# Patient Record
Sex: Male | Born: 1985 | Race: Black or African American | Hispanic: No | Marital: Single | Smoking: Former smoker
Health system: Southern US, Community
[De-identification: ages and names within clinical notes are randomized; demographics above are authoritative.]

## PROBLEM LIST (undated history)

## (undated) DIAGNOSIS — L0291 Cutaneous abscess, unspecified: Secondary | ICD-10-CM

---
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
## 2003-08-25 ENCOUNTER — Emergency Department (HOSPITAL_COMMUNITY): Admission: EM | Admit: 2003-08-25 | Discharge: 2003-08-26 | Payer: Self-pay | Admitting: Emergency Medicine

## 2003-08-26 ENCOUNTER — Encounter: Payer: Self-pay | Admitting: Emergency Medicine

## 2004-09-12 ENCOUNTER — Emergency Department (HOSPITAL_COMMUNITY): Admission: EM | Admit: 2004-09-12 | Discharge: 2004-09-12 | Payer: Self-pay | Admitting: Emergency Medicine

## 2004-11-02 ENCOUNTER — Emergency Department (HOSPITAL_COMMUNITY): Admission: EM | Admit: 2004-11-02 | Discharge: 2004-11-02 | Payer: Self-pay

## 2005-08-13 ENCOUNTER — Emergency Department (HOSPITAL_COMMUNITY): Admission: EM | Admit: 2005-08-13 | Discharge: 2005-08-13 | Payer: Self-pay | Admitting: Emergency Medicine

## 2007-02-09 ENCOUNTER — Emergency Department (HOSPITAL_COMMUNITY): Admission: EM | Admit: 2007-02-09 | Discharge: 2007-02-10 | Payer: Self-pay | Admitting: Emergency Medicine

## 2007-10-10 ENCOUNTER — Emergency Department (HOSPITAL_COMMUNITY): Admission: EM | Admit: 2007-10-10 | Discharge: 2007-10-10 | Payer: Self-pay | Admitting: Emergency Medicine

## 2008-02-10 ENCOUNTER — Emergency Department (HOSPITAL_COMMUNITY): Admission: EM | Admit: 2008-02-10 | Discharge: 2008-02-10 | Payer: Self-pay | Admitting: Emergency Medicine

## 2008-04-06 ENCOUNTER — Emergency Department (HOSPITAL_COMMUNITY): Admission: EM | Admit: 2008-04-06 | Discharge: 2008-04-06 | Payer: Self-pay | Admitting: Emergency Medicine

## 2008-04-21 ENCOUNTER — Emergency Department (HOSPITAL_COMMUNITY): Admission: EM | Admit: 2008-04-21 | Discharge: 2008-04-21 | Payer: Self-pay | Admitting: Emergency Medicine

## 2008-05-25 ENCOUNTER — Emergency Department (HOSPITAL_COMMUNITY): Admission: EM | Admit: 2008-05-25 | Discharge: 2008-05-25 | Payer: Self-pay | Admitting: Emergency Medicine

## 2008-12-04 ENCOUNTER — Emergency Department (HOSPITAL_COMMUNITY): Admission: EM | Admit: 2008-12-04 | Discharge: 2008-12-04 | Payer: Self-pay | Admitting: Emergency Medicine

## 2009-01-10 ENCOUNTER — Emergency Department (HOSPITAL_COMMUNITY): Admission: EM | Admit: 2009-01-10 | Discharge: 2009-01-10 | Payer: Self-pay | Admitting: Emergency Medicine

## 2009-03-01 ENCOUNTER — Emergency Department (HOSPITAL_COMMUNITY): Admission: EM | Admit: 2009-03-01 | Discharge: 2009-03-01 | Payer: Self-pay | Admitting: Emergency Medicine

## 2009-03-21 ENCOUNTER — Emergency Department (HOSPITAL_COMMUNITY): Admission: EM | Admit: 2009-03-21 | Discharge: 2009-03-21 | Payer: Self-pay | Admitting: Emergency Medicine

## 2009-08-09 ENCOUNTER — Emergency Department (HOSPITAL_COMMUNITY): Admission: EM | Admit: 2009-08-09 | Discharge: 2009-08-09 | Payer: Self-pay | Admitting: Emergency Medicine

## 2009-10-12 ENCOUNTER — Emergency Department (HOSPITAL_COMMUNITY): Admission: EM | Admit: 2009-10-12 | Discharge: 2009-10-12 | Payer: Self-pay | Admitting: Emergency Medicine

## 2010-10-29 ENCOUNTER — Emergency Department (HOSPITAL_BASED_OUTPATIENT_CLINIC_OR_DEPARTMENT_OTHER): Admission: EM | Admit: 2010-10-29 | Discharge: 2010-10-29 | Payer: Self-pay | Admitting: Emergency Medicine

## 2010-11-28 ENCOUNTER — Emergency Department (HOSPITAL_COMMUNITY)
Admission: EM | Admit: 2010-11-28 | Discharge: 2010-11-28 | Payer: Self-pay | Source: Home / Self Care | Admitting: Emergency Medicine

## 2011-01-28 ENCOUNTER — Emergency Department (HOSPITAL_BASED_OUTPATIENT_CLINIC_OR_DEPARTMENT_OTHER)
Admission: EM | Admit: 2011-01-28 | Discharge: 2011-01-28 | Disposition: A | Discharge status: Home / Self Care | Payer: Self-pay | Attending: Emergency Medicine | Admitting: Emergency Medicine

## 2011-01-28 ENCOUNTER — Emergency Department (INDEPENDENT_AMBULATORY_CARE_PROVIDER_SITE_OTHER): Payer: Self-pay

## 2011-01-28 DIAGNOSIS — J40 Bronchitis, not specified as acute or chronic: Secondary | ICD-10-CM | POA: Insufficient documentation

## 2011-01-28 DIAGNOSIS — R05 Cough: Secondary | ICD-10-CM

## 2011-01-28 DIAGNOSIS — R059 Cough, unspecified: Secondary | ICD-10-CM | POA: Insufficient documentation

## 2011-02-04 ENCOUNTER — Emergency Department (HOSPITAL_BASED_OUTPATIENT_CLINIC_OR_DEPARTMENT_OTHER)
Admission: EM | Admit: 2011-02-04 | Discharge: 2011-02-04 | Disposition: A | Discharge status: Home / Self Care | Payer: Self-pay | Attending: Emergency Medicine | Admitting: Emergency Medicine

## 2011-02-04 DIAGNOSIS — R197 Diarrhea, unspecified: Secondary | ICD-10-CM | POA: Insufficient documentation

## 2011-03-15 ENCOUNTER — Emergency Department (HOSPITAL_BASED_OUTPATIENT_CLINIC_OR_DEPARTMENT_OTHER)
Admission: EM | Admit: 2011-03-15 | Discharge: 2011-03-15 | Disposition: A | Discharge status: Home / Self Care | Payer: Self-pay | Attending: Emergency Medicine | Admitting: Emergency Medicine

## 2011-03-15 DIAGNOSIS — R112 Nausea with vomiting, unspecified: Secondary | ICD-10-CM | POA: Insufficient documentation

## 2011-03-15 DIAGNOSIS — R197 Diarrhea, unspecified: Secondary | ICD-10-CM | POA: Insufficient documentation

## 2011-03-15 LAB — URINALYSIS, ROUTINE W REFLEX MICROSCOPIC
Ketones, ur: 15 mg/dL — AB
Nitrite: NEGATIVE
Specific Gravity, Urine: 1.023 (ref 1.005–1.030)
Urobilinogen, UA: 0.2 mg/dL (ref 0.0–1.0)

## 2011-06-12 ENCOUNTER — Emergency Department (HOSPITAL_BASED_OUTPATIENT_CLINIC_OR_DEPARTMENT_OTHER)
Admission: EM | Admit: 2011-06-12 | Discharge: 2011-06-12 | Disposition: A | Discharge status: Home / Self Care | Payer: Self-pay | Attending: Emergency Medicine | Admitting: Emergency Medicine

## 2011-06-12 ENCOUNTER — Emergency Department (INDEPENDENT_AMBULATORY_CARE_PROVIDER_SITE_OTHER): Payer: Self-pay

## 2011-06-12 DIAGNOSIS — R079 Chest pain, unspecified: Secondary | ICD-10-CM | POA: Insufficient documentation

## 2011-06-12 LAB — CK TOTAL AND CKMB (NOT AT ARMC)
CK, MB: 1.9 ng/mL (ref 0.3–4.0)
Relative Index: 1.1 (ref 0.0–2.5)
Total CK: 178 U/L (ref 7–232)

## 2011-06-12 LAB — DIFFERENTIAL
Eosinophils Absolute: 0.1 10*3/uL (ref 0.0–0.7)
Eosinophils Relative: 2 % (ref 0–5)
Lymphs Abs: 2.5 10*3/uL (ref 0.7–4.0)
Monocytes Absolute: 0.6 10*3/uL (ref 0.1–1.0)
Neutro Abs: 1.2 10*3/uL — ABNORMAL LOW (ref 1.7–7.7)
Neutrophils Relative %: 27 % — ABNORMAL LOW (ref 43–77)

## 2011-06-12 LAB — CBC
HCT: 40.5 % (ref 39.0–52.0)
Hemoglobin: 14.4 g/dL (ref 13.0–17.0)
MCHC: 35.6 g/dL (ref 30.0–36.0)
MCV: 76.9 fL — ABNORMAL LOW (ref 78.0–100.0)
WBC: 4.4 10*3/uL (ref 4.0–10.5)

## 2011-06-12 LAB — BASIC METABOLIC PANEL
Chloride: 106 mEq/L (ref 96–112)
Glucose, Bld: 92 mg/dL (ref 70–99)

## 2011-06-12 LAB — TROPONIN I: Troponin I: <0.3 ng/mL (ref <0.30)

## 2011-08-20 ENCOUNTER — Encounter: Payer: Self-pay | Admitting: *Deleted

## 2011-08-20 ENCOUNTER — Emergency Department (HOSPITAL_BASED_OUTPATIENT_CLINIC_OR_DEPARTMENT_OTHER)
Admission: EM | Admit: 2011-08-20 | Discharge: 2011-08-20 | Disposition: A | Discharge status: Home / Self Care | Payer: Self-pay | Attending: Emergency Medicine | Admitting: Emergency Medicine

## 2011-08-20 DIAGNOSIS — H00019 Hordeolum externum unspecified eye, unspecified eyelid: Secondary | ICD-10-CM | POA: Insufficient documentation

## 2011-08-20 DIAGNOSIS — H5710 Ocular pain, unspecified eye: Secondary | ICD-10-CM | POA: Insufficient documentation

## 2011-08-20 NOTE — ED Provider Notes (Signed)
History     CSN: [id number] Arrival date & time: 08/20/2011  7:10 PM  Chief Complaint  Patient presents with  . Eye Pain   HPI Comments: Pt states that he thinks that he has a stye in his left eye but he has been using some old drops that he didn't think he was getting any relief  Patient is a 25 y.o. male presenting with eye pain. The history is provided by the patient. No language interpreter was used.  Eye Pain This is a new problem. The current episode started in the past 7 days. The problem occurs constantly. The problem has been unchanged. Pertinent negatives include no fever or sore throat. The symptoms are aggravated by nothing.    History reviewed. No pertinent past medical history.  History reviewed. No pertinent past surgical history.  History reviewed. No pertinent family history.  History  Substance Use Topics  . Smoking status: Never Smoker   . Smokeless tobacco: Not on file  . Alcohol Use: No      Review of Systems  Constitutional: Negative for fever.  HENT: Negative for sore throat.   Eyes: Positive for pain.  All other systems reviewed and are negative.    Physical Exam  BP 135/80  Pulse 67  Temp(Src) 98.9 F (37.2 C) (Oral)  Resp 18  Ht 5\' 8"  (1.727 m)  Wt 197 lb (89.359 kg)  BMI 29.95 kg/m2  SpO2 100%  Physical Exam  Nursing note and vitals reviewed. Constitutional: He appears well-developed and well-nourished.  HENT:  Head: Normocephalic and atraumatic.  Eyes: Conjunctivae and EOM are normal. Pupils are equal, round, and reactive to light. Left eye exhibits hordeolum.  Cardiovascular: Normal rate and regular rhythm.   Pulmonary/Chest: Effort normal and breath sounds normal.    ED Course  Procedures  MDM Discussed treatment with the pt for stye      Teressa Lower, NP 08/20/11 1940

## 2011-08-20 NOTE — ED Notes (Signed)
Pt states his left eye was draining and itching before he noticed the sty in his eye.

## 2011-08-21 NOTE — ED Provider Notes (Signed)
Medical screening examination/treatment/procedure(s) were performed by non-physician practitioner and as supervising physician I was immediately available for consultation/collaboration.  Erynne Kealey M Analis Distler, MD 08/21/11 1326 

## 2012-01-09 ENCOUNTER — Emergency Department (HOSPITAL_BASED_OUTPATIENT_CLINIC_OR_DEPARTMENT_OTHER)
Admission: EM | Admit: 2012-01-09 | Discharge: 2012-01-09 | Disposition: A | Discharge status: Home / Self Care | Payer: Self-pay | Attending: Emergency Medicine | Admitting: Emergency Medicine

## 2012-01-09 ENCOUNTER — Encounter (HOSPITAL_BASED_OUTPATIENT_CLINIC_OR_DEPARTMENT_OTHER): Payer: Self-pay | Admitting: *Deleted

## 2012-01-09 DIAGNOSIS — R05 Cough: Secondary | ICD-10-CM | POA: Insufficient documentation

## 2012-01-09 DIAGNOSIS — IMO0001 Reserved for inherently not codable concepts without codable children: Secondary | ICD-10-CM | POA: Insufficient documentation

## 2012-01-09 DIAGNOSIS — J069 Acute upper respiratory infection, unspecified: Secondary | ICD-10-CM | POA: Insufficient documentation

## 2012-01-09 DIAGNOSIS — R059 Cough, unspecified: Secondary | ICD-10-CM | POA: Insufficient documentation

## 2012-01-09 MED ORDER — LORATADINE-PSEUDOEPHEDRINE ER 10-240 MG PO TB24: 1.0000 | ORAL_TABLET | Freq: Every day | ORAL | Status: DC

## 2012-01-09 MED ORDER — NAPROXEN 500 MG PO TABS: 500.0000 mg | ORAL_TABLET | Freq: Two times a day (BID) | ORAL | Status: DC

## 2012-01-09 NOTE — ED Notes (Signed)
Aching all over, sore throat, stuffy nose and feels tired. Symptoms x 2 days.

## 2012-01-09 NOTE — ED Provider Notes (Signed)
History     CSN: [id number]  Arrival date & time 01/09/12  1110   First MD Initiated Contact with Patient 01/09/12 1124      Chief Complaint  Patient presents with  . URI    (Consider location/radiation/quality/duration/timing/severity/associated sxs/prior treatment) Patient is a 26 y.o. male presenting with URI. The history is provided by the patient. No language interpreter was used.  URI The primary symptoms include fatigue, cough and myalgias. Primary symptoms do not include fever, headaches, ear pain, sore throat, abdominal pain, nausea, vomiting or arthralgias. The current episode started 2 days ago. This is a new problem. The problem has been gradually worsening.  The fatigue began today. The fatigue has been worsening since its onset.  The cough began 2 days ago. The cough is new. The cough is dry.  Myalgias began 2 days ago. The myalgias have been unchanged since their onset. The myalgias are generalized. The myalgias are dull. The discomfort from the myalgias is mild. The myalgias are not associated with weakness.  Symptoms associated with the illness include congestion and rhinorrhea.    History reviewed. No pertinent past medical history.  History reviewed. No pertinent past surgical history.  No family history on file.  History  Substance Use Topics  . Smoking status: Never Smoker   . Smokeless tobacco: Not on file  . Alcohol Use: No      Review of Systems  Constitutional: Positive for fatigue. Negative for fever, activity change and appetite change.  HENT: Positive for congestion and rhinorrhea. Negative for ear pain, sore throat, neck pain and neck stiffness.   Respiratory: Positive for cough. Negative for shortness of breath.   Cardiovascular: Negative for chest pain and palpitations.  Gastrointestinal: Negative for nausea, vomiting and abdominal pain.  Genitourinary: Negative for dysuria, urgency, frequency and flank pain.  Musculoskeletal: Positive for  myalgias. Negative for back pain and arthralgias.  Neurological: Negative for dizziness, weakness, light-headedness, numbness and headaches.  All other systems reviewed and are negative.    Allergies  Review of patient's allergies indicates no known allergies.  Home Medications   Current Outpatient Rx  Name Route Sig Dispense Refill  . LORATADINE-PSEUDOEPHEDRINE ER 10-240 MG PO TB24 Oral Take 1 tablet by mouth daily. 5 tablet 0  . NAPROXEN 500 MG PO TABS Oral Take 1 tablet (500 mg total) by mouth 2 (two) times daily. 30 tablet 0    BP 133/66  Pulse 83  Temp(Src) 98.8 F (37.1 C) (Oral)  Resp 20  SpO2 99%  Physical Exam  Nursing note and vitals reviewed. Constitutional: He is oriented to person, place, and time. He appears well-developed and well-nourished. No distress.  HENT:  Head: Normocephalic and atraumatic.  Right Ear: External ear normal.  Left Ear: External ear normal.  Mouth/Throat: Oropharynx is clear and moist. No oropharyngeal exudate.  Eyes: Conjunctivae and EOM are normal. Pupils are equal, round, and reactive to light.  Neck: Normal range of motion. Neck supple.  Cardiovascular: Normal rate, regular rhythm, normal heart sounds and intact distal pulses.  Exam reveals no gallop and no friction rub.   No murmur heard. Pulmonary/Chest: Effort normal and breath sounds normal. No respiratory distress.  Abdominal: Soft. There is no tenderness.  Musculoskeletal: Normal range of motion. He exhibits no tenderness.  Lymphadenopathy:    He has no cervical adenopathy.  Neurological: He is alert and oriented to person, place, and time. No cranial nerve deficit.  Skin: Skin is warm and dry. No rash noted.  ED Course  Procedures (including critical care time)  Labs Reviewed - No data to display No results found.   1. URI (upper respiratory infection)       MDM   consistent with URI. I have no concern for influenza at this time. There is no indication for  chest x-ray or additional testing at this time. He'll be placed on Naprosyn for myalgias and Claritin-D for his symptom relief. Instructed to remain hydrated at home and to followup with his primary care physician as needed         Dayton Bailiff, MD 01/09/12 1140

## 2012-02-18 ENCOUNTER — Encounter (HOSPITAL_BASED_OUTPATIENT_CLINIC_OR_DEPARTMENT_OTHER): Payer: Self-pay | Admitting: *Deleted

## 2012-02-18 ENCOUNTER — Emergency Department (HOSPITAL_BASED_OUTPATIENT_CLINIC_OR_DEPARTMENT_OTHER)
Admission: EM | Admit: 2012-02-18 | Discharge: 2012-02-18 | Disposition: A | Discharge status: Home Health | Payer: Self-pay | Attending: Emergency Medicine | Admitting: Emergency Medicine

## 2012-02-18 DIAGNOSIS — H00019 Hordeolum externum unspecified eye, unspecified eyelid: Secondary | ICD-10-CM

## 2012-02-18 DIAGNOSIS — H5789 Other specified disorders of eye and adnexa: Secondary | ICD-10-CM | POA: Insufficient documentation

## 2012-02-18 MED ORDER — ERYTHROMYCIN 5 MG/GM OP OINT: TOPICAL_OINTMENT | OPHTHALMIC | Status: AC

## 2012-02-18 NOTE — Discharge Instructions (Signed)
Sty  A sty (hordeolum) is an infection of a gland in the eyelid located at the base of the eyelash. A sty may develop a white or yellow head of pus. It can be puffy (swollen). Usually, the sty will burst and pus will come out on its own. They do not leave lumps in the eyelid once they drain.  A sty is often confused with another form of cyst of the eyelid called a chalazion. Chalazions occur within the eyelid and not on the edge where the bases of the eyelashes are. They often are red, sore and then form firm lumps in the eyelid.  CAUSES    Germs (bacteria).   Lasting (chronic) eyelid inflammation.  SYMPTOMS    Tenderness, redness and swelling along the edge of the eyelid at the base of the eyelashes.   Sometimes, there is a white or yellow head of pus. It may or may not drain.  DIAGNOSIS   An ophthalmologist will be able to distinguish between a sty and a chalazion and treat the condition appropriately.   TREATMENT    Styes are typically treated with warm packs (compresses) until drainage occurs.   In rare cases, medicines that kill germs (antibiotics) may be prescribed. These antibiotics may be in the form of drops, cream or pills.   If a hard lump has formed, it is generally necessary to do a small incision and remove the hardened contents of the cyst in a minor surgical procedure done in the office.   In suspicious cases, your caregiver may send the contents of the cyst to the lab to be certain that it is not a rare, but dangerous form of cancer of the glands of the eyelid.  HOME CARE INSTRUCTIONS    Wash your hands often and dry them with a clean towel. Avoid touching your eyelid. This may spread the infection to other parts of the eye.   Apply heat to your eyelid for 10 to 20 minutes, several times a day, to ease pain and help to heal it faster.   Do not squeeze the sty. Allow it to drain on its own. Wash your eyelid carefully 3 to 4 times per day to remove any pus.  SEEK IMMEDIATE MEDICAL CARE IF:     Your eye becomes painful or puffy (swollen).   Your vision changes.   Your sty does not drain by itself within 3 days.   Your sty comes back within a short period of time, even with treatment.   You have redness (inflammation) around the eye.   You have a fever.  Document Released: 09/13/2005 Document Revised: 11/23/2011 Document Reviewed: 05/18/2009  [employer] Patient Information 2012 [employer]

## 2012-02-18 NOTE — ED Notes (Signed)
Pt with swollen red right eyelid states that he recently has had one in the opposite eye

## 2012-02-18 NOTE — ED Provider Notes (Signed)
History   This chart was scribed for Felisa Bonier, MD by Melba Coon. The patient was seen in room [facility] and the patient's care was started at 9:04PM.    CSN: [id number]  Arrival date & time 02/18/12  1949   First MD Initiated Contact with Patient 02/18/12 2045      Chief Complaint  Patient presents with  . Stye    (Consider location/radiation/quality/duration/timing/severity/associated sxs/prior treatment) HPI Craig Wheeler is a 26 y.o. male who presents to the Emergency Department complaining of a persistent, moderate to severe stye of the right upper eyelid. Pt states that he has recently had a stye on the left eyelid. Pt has been doing warm compresses over the affected area with a warm washcloth. Eye drainage present. No HA, no orbital pain behind posterior right eye with occular movement, neck pain, CP, abd pain, extremity pain or edema. No known allergies. No other pertinent medical symptoms.  History reviewed. No pertinent past medical history.  History reviewed. No pertinent past surgical history.  History reviewed. No pertinent family history.  History  Substance Use Topics  . Smoking status: Never Smoker   . Smokeless tobacco: Not on file  . Alcohol Use: No      Review of Systems 10 Systems reviewed and are negative for acute change except as noted in the HPI.  Allergies  Review of patient's allergies indicates no known allergies.  Home Medications   Current Outpatient Rx  Name Route Sig Dispense Refill  . LORATADINE-PSEUDOEPHEDRINE ER 10-240 MG PO TB24 Oral Take 1 tablet by mouth daily. 5 tablet 0  . NAPROXEN 500 MG PO TABS Oral Take 1 tablet (500 mg total) by mouth 2 (two) times daily. 30 tablet 0    BP 126/78  Pulse 78  Temp(Src) 98.8 F (37.1 C) (Oral)  Resp 16  SpO2 100%  Physical Exam  Nursing note and vitals reviewed. Constitutional: He is oriented to person, place, and time. He appears well-developed and well-nourished. No  distress.       Awake, alert, nontoxic appearance.  HENT:  Head: Normocephalic and atraumatic.  Right Ear: External ear normal.  Left Ear: External ear normal.  Mouth/Throat: Oropharynx is clear and moist.  Eyes: Conjunctivae and EOM are normal. Pupils are equal, round, and reactive to light. Right eye exhibits no discharge. Left eye exhibits no discharge.       Swelling and erythema of right upper eyelid with concentration at lateral aspect, no pointing fluctuance, evidence of prior drainage; EDMD everted upper eyelid - no evidence of foreign body or infection; no erythema around orbit to suggest cellulitis.  Neck: Normal range of motion. Neck supple.  Cardiovascular: Normal rate, regular rhythm and normal heart sounds.  Exam reveals no gallop and no friction rub.   No murmur heard. Pulmonary/Chest: Effort normal and breath sounds normal. No respiratory distress. He exhibits no tenderness.  Abdominal: Soft. There is no tenderness. There is no rebound.  Musculoskeletal: He exhibits no tenderness.       Baseline ROM, no obvious new focal weakness.  Lymphadenopathy:    He has no cervical adenopathy.  Neurological: He is alert and oriented to person, place, and time.       Mental status and motor strength appears baseline for patient and situation.  Skin: Skin is warm and dry. No rash noted.  Psychiatric: He has a normal mood and affect. His behavior is normal.    ED Course  Procedures (including critical care time)  DIAGNOSTIC STUDIES: Oxygen Saturation is 100% on room air, normal by my interpretation.    COORDINATION OF CARE:  9:09PM - EDMD will prescribe abx (bacitracin) for right upper eyelid.   Labs Reviewed - No data to display No results found.   No diagnosis found.    MDM   Pt has apparent uncomplicated hordeolum. EDMD will advise to continue warm, moist compresses and will prescribe ophthalmic abx.  I personally performed the services described in this  documentation, which was scribed in my presence. The recorded information has been reviewed and considered.      Felisa Bonier, MD 02/18/12 2120

## 2012-12-11 DIAGNOSIS — R109 Unspecified abdominal pain: Secondary | ICD-10-CM | POA: Insufficient documentation

## 2012-12-11 DIAGNOSIS — K5289 Other specified noninfective gastroenteritis and colitis: Secondary | ICD-10-CM | POA: Insufficient documentation

## 2012-12-12 ENCOUNTER — Emergency Department (HOSPITAL_BASED_OUTPATIENT_CLINIC_OR_DEPARTMENT_OTHER)
Admission: EM | Admit: 2012-12-12 | Discharge: 2012-12-12 | Disposition: A | Discharge status: Home / Self Care | Payer: Self-pay | Attending: Emergency Medicine | Admitting: Emergency Medicine

## 2012-12-12 ENCOUNTER — Encounter (HOSPITAL_BASED_OUTPATIENT_CLINIC_OR_DEPARTMENT_OTHER): Payer: Self-pay | Admitting: Emergency Medicine

## 2012-12-12 DIAGNOSIS — K529 Noninfective gastroenteritis and colitis, unspecified: Secondary | ICD-10-CM

## 2012-12-12 LAB — BASIC METABOLIC PANEL
BUN: 9 mg/dL (ref 6–23)
CO2: 32 mEq/L (ref 19–32)
Calcium: 9.2 mg/dL (ref 8.4–10.5)
Chloride: 106 mEq/L (ref 96–112)
Creatinine, Ser: 1.2 mg/dL (ref 0.50–1.35)
Potassium: 4.4 mEq/L (ref 3.5–5.1)
Sodium: 147 mEq/L — ABNORMAL HIGH (ref 135–145)

## 2012-12-12 MED ORDER — ONDANSETRON 8 MG PO TBDP
8.0000 mg | ORAL_TABLET | Freq: Once | ORAL | Status: AC
  Administered 2012-12-12: 8 mg via ORAL

## 2012-12-12 MED ORDER — ACETAMINOPHEN 325 MG PO TABS
ORAL_TABLET | ORAL | Status: AC
  Filled 2012-12-12: qty 3

## 2012-12-12 MED ORDER — ACETAMINOPHEN 325 MG PO TABS
975.0000 mg | ORAL_TABLET | Freq: Once | ORAL | Status: AC
  Administered 2012-12-12: 975 mg via ORAL

## 2012-12-12 MED ORDER — ONDANSETRON 8 MG PO TBDP
ORAL_TABLET | ORAL | Status: AC
  Filled 2012-12-12: qty 1

## 2012-12-12 MED ORDER — ONDANSETRON HCL 4 MG PO TABS: 8.0000 mg | ORAL_TABLET | Freq: Four times a day (QID) | ORAL | Status: DC

## 2012-12-12 NOTE — ED Provider Notes (Signed)
History     CSN: [id number]  Arrival date & time 12/11/12  2355   First MD Initiated Contact with Patient 12/12/12 0005      Chief Complaint  Patient presents with  . Emesis  . Diarrhea    (Consider location/radiation/quality/duration/timing/severity/associated sxs/prior treatment) HPI Patient awakened 1 hour ago vomiting. Hasn't vomited 3 or 4 times. Also had 3 or 4 episodes of diarrhea tonight. Symptoms accompanied by diffuse crampy abdominal pain. He is presently asymptomatic except for minimal diffuse abdominal discomfort no nausea no lightheadedness with standing. No fever no blood per rectum no hematemesis. Treated with baking soda prior to coming here. Feels much improved at History reviewed. No pertinent past medical history. Past medical history negative History reviewed. No pertinent past surgical history. Surgical history negative No family history on file.  History  Substance Use Topics  . Smoking status: Never Smoker   . Smokeless tobacco: Not on file  . Alcohol Use: No      Review of Systems  Gastrointestinal: Positive for nausea, vomiting, abdominal pain and diarrhea.  All other systems reviewed and are negative.    Allergies  Review of patient's allergies indicates no known allergies.  Home Medications   Current Outpatient Rx  Name  Route  Sig  Dispense  Refill  . ADULT MULTIVITAMIN W/MINERALS CH   Oral   Take 1 tablet by mouth daily.           BP 128/77  Pulse 82  Temp 98.4 F (36.9 C) (Oral)  Resp 18  Ht 5\' 8"  (1.727 m)  Wt 217 lb (98.431 kg)  BMI 32.99 kg/m2  SpO2 98%  Physical Exam  Nursing note and vitals reviewed. Constitutional: He appears well-developed and well-nourished.  HENT:  Head: Normocephalic and atraumatic.  Eyes: Conjunctivae normal are normal. Pupils are equal, round, and reactive to light.  Neck: Neck supple. No tracheal deviation present. No thyromegaly present.  Cardiovascular: Normal rate and regular  rhythm.   No murmur heard. Pulmonary/Chest: Effort normal and breath sounds normal.  Abdominal: Soft. Bowel sounds are normal. He exhibits no distension. There is no tenderness.       Obese  Genitourinary: Penis normal.       Normal male genitalia  Musculoskeletal: Normal range of motion. He exhibits no edema and no tenderness.  Neurological: He is alert. Coordination normal.  Skin: Skin is warm and dry. No rash noted.  Psychiatric: He has a normal mood and affect.    ED Course  Procedures (including critical care time)   Labs Reviewed  BASIC METABOLIC PANEL   No results found. Results for orders placed during the hospital encounter of 12/12/12  BASIC METABOLIC PANEL      Component Value Range   Sodium 147 (*) 135 - 145 mEq/L   Potassium 4.4  3.5 - 5.1 mEq/L   Chloride 106  96 - 112 mEq/L   CO2 32  19 - 32 mEq/L   Glucose, Bld 116 (*) 70 - 99 mg/dL   BUN 9  6 - 23 mg/dL   Creatinine, Ser 2.84  0.50 - 1.35 mg/dL   Calcium 9.2  8.4 - 13.2 mg/dL   GFR calc non Af Amer 81 (*) >90 mL/min   GFR calc Af Amer >90  >90 mL/min   No results found.  No diagnosis found.  12:45 AM reports mild abdominal discomfort and feels slightly nauseated. Tylenol and Zofran ordered.  1:05 AM feels much improved note nausea after treatment with  Zofran. He drinks a lot of water without nausea or vomiting   MDM   Assesssment: clinically no signs of dehydration Plan prescription Zofran, Imodium as needed for diarrhea Tylenol for pain Given resource guide to get primary care physician Diagnosis gastroenteritis        Doug Sou, MD 12/12/12 0107

## 2012-12-12 NOTE — ED Notes (Signed)
Pt states he drank baking soda and water earlier tonight for indigestion. Awoke about an hour ago with diarrhea and vomited x 1 episode.

## 2012-12-12 NOTE — ED Notes (Signed)
MD at bedside. 

## 2012-12-13 NOTE — ED Notes (Signed)
Pt here requesting for work note to be extended through the weekend. Spoke with pt stating that he was released to return to work on 12-27 and that the work note would not be extended.

## 2013-06-29 ENCOUNTER — Encounter (HOSPITAL_BASED_OUTPATIENT_CLINIC_OR_DEPARTMENT_OTHER): Payer: Self-pay | Admitting: *Deleted

## 2013-06-29 ENCOUNTER — Emergency Department (HOSPITAL_BASED_OUTPATIENT_CLINIC_OR_DEPARTMENT_OTHER)
Admission: EM | Admit: 2013-06-29 | Discharge: 2013-06-29 | Disposition: A | Discharge status: Home Health | Payer: 59 | Attending: Emergency Medicine | Admitting: Emergency Medicine

## 2013-06-29 DIAGNOSIS — J3489 Other specified disorders of nose and nasal sinuses: Secondary | ICD-10-CM | POA: Insufficient documentation

## 2013-06-29 DIAGNOSIS — J029 Acute pharyngitis, unspecified: Secondary | ICD-10-CM | POA: Insufficient documentation

## 2013-06-29 LAB — RAPID STREP SCREEN (MED CTR MEBANE ONLY): Streptococcus, Group A Screen (Direct): NEGATIVE

## 2013-06-29 MED ORDER — PENICILLIN V POTASSIUM 500 MG PO TABS: 500.0000 mg | ORAL_TABLET | Freq: Four times a day (QID) | ORAL | Status: AC

## 2013-06-29 NOTE — ED Notes (Signed)
C/o sorethroat since Friday. Fevers unknown. C/o generally not feeling well. C/o sinus drainage as swell. Denies n/v/d

## 2013-06-29 NOTE — ED Provider Notes (Signed)
   History    CSN: [id number] Arrival date & time 06/29/13  1950  First MD Initiated Contact with Patient 06/29/13 2144     Chief Complaint  Patient presents with  . Sore Throat   (Consider location/radiation/quality/duration/timing/severity/associated sxs/prior Treatment) Patient is a 27 y.o. male presenting with pharyngitis. The history is provided by the patient. No language interpreter was used.  Sore Throat This is a new problem. Episode onset: 3 days. The problem occurs constantly. The problem has been gradually worsening. Associated symptoms include a sore throat. Nothing aggravates the symptoms. The treatment provided moderate relief.  Pt complains of a sore throat.  Pt reports symptoms began 3 days ago with sinus drainage.  Sinus drainage has improved but throat is worse. History reviewed. No pertinent past medical history. History reviewed. No pertinent past surgical history. No family history on file. History  Substance Use Topics  . Smoking status: Never Smoker   . Smokeless tobacco: Not on file  . Alcohol Use: No    Review of Systems  HENT: Positive for sore throat.   All other systems reviewed and are negative.    Allergies  Review of patient's allergies indicates no known allergies.  Home Medications   Current Outpatient Rx  Name  Route  Sig  Dispense  Refill  . Multiple Vitamin (MULITIVITAMIN WITH MINERALS) TABS   Oral   Take 1 tablet by mouth daily.         . ondansetron (ZOFRAN) 4 MG tablet   Oral   Take 2 tablets (8 mg total) by mouth every 6 (six) hours.   12 tablet   0    BP 134/87  Pulse 65  Temp(Src) 99 F (37.2 C) (Oral)  Resp 20  Ht 5\' 6"  (1.676 m)  Wt 220 lb (99.791 kg)  BMI 35.53 kg/m2  SpO2 99% Physical Exam  Nursing note and vitals reviewed. Constitutional: He appears well-developed and well-nourished.  HENT:  Head: Normocephalic and atraumatic.  Right Ear: External ear normal.  Left Ear: External ear normal.  Eyes:  Conjunctivae and EOM are normal. Pupils are equal, round, and reactive to light.  Neck: Normal range of motion. Neck supple.  Cardiovascular: Normal rate and normal heart sounds.   Pulmonary/Chest: Effort normal and breath sounds normal.  Abdominal: Soft.  Neurological: He is alert.  Skin: Skin is warm.  Psychiatric: He has a normal mood and affect.    ED Course  Procedures (including critical care time) Labs Reviewed  RAPID STREP SCREEN  CULTURE, GROUP A STREP   No results found. 1. Pharyngitis     MDM  pcn vk 500    Pt advised ibuprofen,  Warm salt water gargles,   Return if any problems.  Lonia Skinner Waldo, PA-C 06/29/13 2211

## 2013-06-29 NOTE — ED Provider Notes (Signed)
Medical screening examination/treatment/procedure(s) were performed by non-physician practitioner and as supervising physician I was immediately available for consultation/collaboration.    Glynn Octave, MD 06/29/13 2220

## 2013-07-01 LAB — CULTURE, GROUP A STREP

## 2013-08-21 ENCOUNTER — Encounter (HOSPITAL_BASED_OUTPATIENT_CLINIC_OR_DEPARTMENT_OTHER): Payer: Self-pay | Admitting: *Deleted

## 2013-08-21 ENCOUNTER — Emergency Department (HOSPITAL_BASED_OUTPATIENT_CLINIC_OR_DEPARTMENT_OTHER)
Admission: EM | Admit: 2013-08-21 | Discharge: 2013-08-21 | Disposition: A | Discharge status: Home / Self Care | Payer: 59 | Attending: Emergency Medicine | Admitting: Emergency Medicine

## 2013-08-21 DIAGNOSIS — L0291 Cutaneous abscess, unspecified: Secondary | ICD-10-CM

## 2013-08-21 DIAGNOSIS — L0231 Cutaneous abscess of buttock: Secondary | ICD-10-CM | POA: Insufficient documentation

## 2013-08-21 HISTORY — DX: Cutaneous abscess, unspecified: L02.91

## 2013-08-21 MED ORDER — HYDROCODONE-ACETAMINOPHEN 5-325 MG PO TABS
1.0000 | ORAL_TABLET | Freq: Once | ORAL | Status: AC
  Administered 2013-08-21: 1 via ORAL
  Filled 2013-08-21: qty 1

## 2013-08-21 MED ORDER — SULFAMETHOXAZOLE-TRIMETHOPRIM 800-160 MG PO TABS: 1.0000 | ORAL_TABLET | Freq: Two times a day (BID) | ORAL | Status: DC

## 2013-08-21 MED ORDER — CEPHALEXIN 500 MG PO CAPS: 500.0000 mg | ORAL_CAPSULE | Freq: Four times a day (QID) | ORAL | Status: DC

## 2013-08-21 MED ORDER — IBUPROFEN 600 MG PO TABS: 600.0000 mg | ORAL_TABLET | Freq: Four times a day (QID) | ORAL | Status: DC | PRN

## 2013-08-21 NOTE — ED Notes (Signed)
Multiple abscesses in the groin area.  Started three days ago, now is draining.

## 2013-08-21 NOTE — ED Provider Notes (Signed)
CSN: [id number]     Arrival date & time 08/21/13  1042 History   First MD Initiated Contact with Patient 08/21/13 1057     Chief Complaint  Patient presents with  . Abscess   (Consider location/radiation/quality/duration/timing/severity/associated sxs/prior Treatment) HPI Comments: Pt comes in with cc of abscess. Pt has hx of abscess in the past. States that his current symptoms started few days back, with increased pain. He started having drainage from the site, and came to the ER. There is no n/v/f/c.  Patient is a 27 y.o. male presenting with abscess. The history is provided by the patient.  Abscess   Past Medical History  Diagnosis Date  . Skin abscess    History reviewed. No pertinent past surgical history. No family history on file. History  Substance Use Topics  . Smoking status: Never Smoker   . Smokeless tobacco: Not on file  . Alcohol Use: No    Review of Systems  Constitutional: Negative for activity change and appetite change.  Respiratory: Negative for cough and shortness of breath.   Cardiovascular: Negative for chest pain.  Gastrointestinal: Negative for abdominal pain.  Genitourinary: Negative for dysuria.  Skin: Positive for rash.  Hematological: Does not bruise/bleed easily.    Allergies  Review of patient's allergies indicates no known allergies.  Home Medications   Current Outpatient Rx  Name  Route  Sig  Dispense  Refill  . cephALEXin (KEFLEX) 500 MG capsule   Oral   Take 1 capsule (500 mg total) by mouth 4 (four) times daily.   40 capsule   0   . ibuprofen (ADVIL,MOTRIN) 600 MG tablet   Oral   Take 1 tablet (600 mg total) by mouth every 6 (six) hours as needed for pain.   30 tablet   0   . Multiple Vitamin (MULITIVITAMIN WITH MINERALS) TABS   Oral   Take 1 tablet by mouth daily.         . ondansetron (ZOFRAN) 4 MG tablet   Oral   Take 2 tablets (8 mg total) by mouth every 6 (six) hours.   12 tablet   0   .  sulfamethoxazole-trimethoprim (SEPTRA DS) 800-160 MG per tablet   Oral   Take 1 tablet by mouth every 12 (twelve) hours.   10 tablet   0    BP 129/66  Temp(Src) 99.5 F (37.5 C) (Oral)  Resp 20  Ht 5\' 7"  (1.702 m)  Wt 208 lb (94.348 kg)  BMI 32.57 kg/m2  SpO2 100% Physical Exam  Nursing note and vitals reviewed. Constitutional: He is oriented to person, place, and time. He appears well-developed.  HENT:  Head: Normocephalic and atraumatic.  Eyes: Conjunctivae and EOM are normal. Pupils are equal, round, and reactive to light.  Neck: Normal range of motion. Neck supple.  Cardiovascular: Normal rate and regular rhythm.   Pulmonary/Chest: Effort normal and breath sounds normal.  Abdominal: He exhibits no distension. There is no tenderness.  Neurological: He is alert and oriented to person, place, and time.  Skin: Skin is warm.  Right medial gluteal fold - there is induration and fluctuance with some purulent drainage. No peri-rectal abscess, no fournier.     ED Course  Procedures (including critical care time) Labs Review Labs Reviewed - No data to display Imaging Review No results found.  MDM   1. Abscess     Pt comes in abscess. Drained. No perirectal abscess. No scrotal redness, induration - no concerns for fournier's. Deep  abscess, Packed, antibiotics given - mostly due to the location.  Return in 2 days for wound check.    Derwood Kaplan, MD 08/21/13 1328

## 2014-01-12 ENCOUNTER — Emergency Department (HOSPITAL_BASED_OUTPATIENT_CLINIC_OR_DEPARTMENT_OTHER)
Admission: EM | Admit: 2014-01-12 | Discharge: 2014-01-12 | Disposition: A | Discharge status: Home / Self Care | Payer: 59 | Attending: Emergency Medicine | Admitting: Emergency Medicine

## 2014-01-12 ENCOUNTER — Encounter (HOSPITAL_BASED_OUTPATIENT_CLINIC_OR_DEPARTMENT_OTHER): Payer: Self-pay | Admitting: Emergency Medicine

## 2014-01-12 DIAGNOSIS — Z872 Personal history of diseases of the skin and subcutaneous tissue: Secondary | ICD-10-CM | POA: Insufficient documentation

## 2014-01-12 DIAGNOSIS — Z792 Long term (current) use of antibiotics: Secondary | ICD-10-CM | POA: Insufficient documentation

## 2014-01-12 DIAGNOSIS — J069 Acute upper respiratory infection, unspecified: Secondary | ICD-10-CM | POA: Insufficient documentation

## 2014-01-12 DIAGNOSIS — R630 Anorexia: Secondary | ICD-10-CM | POA: Insufficient documentation

## 2014-01-12 LAB — RAPID STREP SCREEN (MED CTR MEBANE ONLY): STREPTOCOCCUS, GROUP A SCREEN (DIRECT): NEGATIVE

## 2014-01-12 MED ORDER — FEXOFENADINE-PSEUDOEPHED ER 60-120 MG PO TB12: 1.0000 | ORAL_TABLET | Freq: Two times a day (BID) | ORAL | Status: DC

## 2014-01-12 NOTE — Discharge Instructions (Signed)

## 2014-01-12 NOTE — ED Notes (Signed)
Pt states has sore throat and fatigue.

## 2014-01-12 NOTE — ED Provider Notes (Signed)
CSN: [id number]     Arrival date & time 01/12/14  [id number] History   First MD Initiated Contact with Patient 01/12/14 1120     Chief Complaint  Patient presents with  . Fatigue  . Sore Throat   (Consider location/radiation/quality/duration/timing/severity/associated sxs/prior Treatment) HPI Comments: Patient presents with sore throat. He states it's been going on for the last 4-5 days. He feels achy and tired. Had a decreased appetite. He has had some runny nose and sinus congestion as well. He denies any known fevers. He denies any vomiting or diarrhea. He has had some mild intermittent headaches but no dizziness or ataxia. He's been using over-the-counter medicines with little relief.  Patient is a 28 y.o. male presenting with pharyngitis.  Sore Throat Associated symptoms include headaches. Pertinent negatives include no chest pain, no abdominal pain and no shortness of breath.    Past Medical History  Diagnosis Date  . Skin abscess    History reviewed. No pertinent past surgical history. No family history on file. History  Substance Use Topics  . Smoking status: Never Smoker   . Smokeless tobacco: Not on file  . Alcohol Use: No    Review of Systems  Constitutional: Positive for appetite change and fatigue. Negative for fever, chills and diaphoresis.  HENT: Positive for congestion, sinus pressure and sore throat. Negative for rhinorrhea and sneezing.   Eyes: Negative.   Respiratory: Negative for cough, chest tightness and shortness of breath.   Cardiovascular: Negative for chest pain and leg swelling.  Gastrointestinal: Negative for nausea, vomiting, abdominal pain, diarrhea and blood in stool.  Genitourinary: Negative for frequency, hematuria, flank pain and difficulty urinating.  Musculoskeletal: Negative for arthralgias and back pain.  Skin: Negative for rash.  Neurological: Positive for headaches. Negative for dizziness, speech difficulty, weakness and numbness.     Allergies  Review of patient's allergies indicates no known allergies.  Home Medications   Current Outpatient Rx  Name  Route  Sig  Dispense  Refill  . cephALEXin (KEFLEX) 500 MG capsule   Oral   Take 1 capsule (500 mg total) by mouth 4 (four) times daily.   40 capsule   0   . fexofenadine-pseudoephedrine (ALLEGRA-D) 60-120 MG per tablet   Oral   Take 1 tablet by mouth every 12 (twelve) hours.   30 tablet   0   . ibuprofen (ADVIL,MOTRIN) 600 MG tablet   Oral   Take 1 tablet (600 mg total) by mouth every 6 (six) hours as needed for pain.   30 tablet   0   . Multiple Vitamin (MULITIVITAMIN WITH MINERALS) TABS   Oral   Take 1 tablet by mouth daily.         . ondansetron (ZOFRAN) 4 MG tablet   Oral   Take 2 tablets (8 mg total) by mouth every 6 (six) hours.   12 tablet   0   . sulfamethoxazole-trimethoprim (SEPTRA DS) 800-160 MG per tablet   Oral   Take 1 tablet by mouth every 12 (twelve) hours.   10 tablet   0    BP 142/91  Pulse 71  Temp(Src) 99.4 F (37.4 C) (Oral)  Resp 18  Ht 5\' 8"  (1.727 m)  Wt 220 lb (99.791 kg)  BMI 33.46 kg/m2  SpO2 100% Physical Exam  Constitutional: He is oriented to person, place, and time. He appears well-developed and well-nourished.  HENT:  Head: Normocephalic and atraumatic.  Positive erythema to the posterior pharynx. No exudates. Uvula is  midline.  Eyes: Pupils are equal, round, and reactive to light.  Neck: Normal range of motion. Neck supple.  Cardiovascular: Normal rate, regular rhythm and normal heart sounds.   Pulmonary/Chest: Effort normal and breath sounds normal. No respiratory distress. He has no wheezes. He has no rales. He exhibits no tenderness.  Abdominal: Soft. Bowel sounds are normal. There is no tenderness. There is no rebound and no guarding.  Musculoskeletal: Normal range of motion. He exhibits no edema.  Lymphadenopathy:    He has cervical adenopathy.  Neurological: He is alert and oriented to  person, place, and time.  Skin: Skin is warm and dry. No rash noted.  Psychiatric: He has a normal mood and affect.    ED Course  Procedures (including critical care time) Labs Review Labs Reviewed  RAPID STREP SCREEN  CULTURE, GROUP A STREP   Imaging Review No results found.  EKG Interpretation   None       MDM   1. Viral URI    Patient with 4 to five-day history of cold symptoms with sore throat. Her strep test is negative. Throat culture is pending. He was advised in symptomatic care was given a prescription for Allegra-D.    Rolan Bucco, MD 01/12/14 1308

## 2014-01-14 LAB — CULTURE, GROUP A STREP

## 2014-11-15 ENCOUNTER — Emergency Department (HOSPITAL_BASED_OUTPATIENT_CLINIC_OR_DEPARTMENT_OTHER): Payer: 59

## 2014-11-15 ENCOUNTER — Encounter (HOSPITAL_BASED_OUTPATIENT_CLINIC_OR_DEPARTMENT_OTHER): Payer: Self-pay | Admitting: *Deleted

## 2014-11-15 ENCOUNTER — Emergency Department (HOSPITAL_BASED_OUTPATIENT_CLINIC_OR_DEPARTMENT_OTHER)
Admission: EM | Admit: 2014-11-15 | Discharge: 2014-11-15 | Disposition: A | Discharge status: Home / Self Care | Payer: 59 | Attending: Emergency Medicine | Admitting: Emergency Medicine

## 2014-11-15 DIAGNOSIS — J069 Acute upper respiratory infection, unspecified: Secondary | ICD-10-CM | POA: Insufficient documentation

## 2014-11-15 DIAGNOSIS — Z872 Personal history of diseases of the skin and subcutaneous tissue: Secondary | ICD-10-CM | POA: Diagnosis not present

## 2014-11-15 DIAGNOSIS — Z792 Long term (current) use of antibiotics: Secondary | ICD-10-CM | POA: Insufficient documentation

## 2014-11-15 DIAGNOSIS — R059 Cough, unspecified: Secondary | ICD-10-CM

## 2014-11-15 DIAGNOSIS — R05 Cough: Secondary | ICD-10-CM | POA: Diagnosis present

## 2014-11-15 DIAGNOSIS — Z79899 Other long term (current) drug therapy: Secondary | ICD-10-CM | POA: Insufficient documentation

## 2014-11-15 NOTE — Discharge Instructions (Signed)
Cough, Adult ° A cough is a reflex that helps clear your throat and airways. It can help heal the body or may be a reaction to an irritated airway. A cough may only last 2 or 3 weeks (acute) or may last more than 8 weeks (chronic).  °CAUSES °Acute cough: °· Viral or bacterial infections. °Chronic cough: °· Infections. °· Allergies. °· Asthma. °· Post-nasal drip. °· Smoking. °· Heartburn or acid reflux. °· Some medicines. °· Chronic lung problems (COPD). °· Cancer. °SYMPTOMS  °· Cough. °· Fever. °· Chest pain. °· Increased breathing rate. °· High-pitched whistling sound when breathing (wheezing). °· Colored mucus that you cough up (sputum). °TREATMENT  °· A bacterial cough may be treated with antibiotic medicine. °· A viral cough must run its course and will not respond to antibiotics. °· Your caregiver may recommend other treatments if you have a chronic cough. °HOME CARE INSTRUCTIONS  °· Only take over-the-counter or prescription medicines for pain, discomfort, or fever as directed by your caregiver. Use cough suppressants only as directed by your caregiver. °· Use a cold steam vaporizer or humidifier in your bedroom or home to help loosen secretions. °· Sleep in a semi-upright position if your cough is worse at night. °· Rest as needed. °· Stop smoking if you smoke. °SEEK IMMEDIATE MEDICAL CARE IF:  °· You have pus in your sputum. °· Your cough starts to worsen. °· You cannot control your cough with suppressants and are losing sleep. °· You begin coughing up blood. °· You have difficulty breathing. °· You develop pain which is getting worse or is uncontrolled with medicine. °· You have a fever. °MAKE SURE YOU:  °· Understand these instructions. °· Will watch your condition. °· Will get help right away if you are not doing well or get worse. °Document Released: 06/02/2011 Document Revised: 02/26/2012 Document Reviewed: 06/02/2011 °ExitCare® Patient Information ©2015 [employer]. This information is not intended  to replace advice given to you by your health care provider. Make sure you discuss any questions you have with your health care provider. °Upper Respiratory Infection, Adult °An upper respiratory infection (URI) is also sometimes known as the common cold. The upper respiratory tract includes the nose, sinuses, throat, trachea, and bronchi. Bronchi are the airways leading to the lungs. Most people improve within 1 week, but symptoms can last up to 2 weeks. A residual cough may last even longer.  °CAUSES °Many different viruses can infect the tissues lining the upper respiratory tract. The tissues become irritated and inflamed and often become very moist. Mucus production is also common. A cold is contagious. You can easily spread the virus to others by oral contact. This includes kissing, sharing a glass, coughing, or sneezing. Touching your mouth or nose and then touching a surface, which is then touched by another person, can also spread the virus. °SYMPTOMS  °Symptoms typically develop 1 to 3 days after you come in contact with a cold virus. Symptoms vary from person to person. They may include: °· Runny nose. °· Sneezing. °· Nasal congestion. °· Sinus irritation. °· Sore throat. °· Loss of voice (laryngitis). °· Cough. °· Fatigue. °· Muscle aches. °· Loss of appetite. °· Headache. °· Low-grade fever. °DIAGNOSIS  °You might diagnose your own cold based on familiar symptoms, since most people get a cold 2 to 3 times a year. Your caregiver can confirm this based on your exam. Most importantly, your caregiver can check that your symptoms are not due to another disease such   as strep throat, sinusitis, pneumonia, asthma, or epiglottitis. Blood tests, throat tests, and X-rays are not necessary to diagnose a common cold, but they may sometimes be helpful in excluding other more serious diseases. Your caregiver will decide if any further tests are required. °RISKS AND COMPLICATIONS  °You may be at risk for a more severe  case of the common cold if you smoke cigarettes, have chronic heart disease (such as heart failure) or lung disease (such as asthma), or if you have a weakened immune system. The very young and very old are also at risk for more serious infections. Bacterial sinusitis, middle ear infections, and bacterial pneumonia can complicate the common cold. The common cold can worsen asthma and chronic obstructive pulmonary disease (COPD). Sometimes, these complications can require emergency medical care and may be life-threatening. °PREVENTION  °The best way to protect against getting a cold is to practice good hygiene. Avoid oral or hand contact with people with cold symptoms. Wash your hands often if contact occurs. There is no clear evidence that vitamin C, vitamin E, echinacea, or exercise reduces the chance of developing a cold. However, it is always recommended to get plenty of rest and practice good nutrition. °TREATMENT  °Treatment is directed at relieving symptoms. There is no cure. Antibiotics are not effective, because the infection is caused by a virus, not by bacteria. Treatment may include: °· Increased fluid intake. Sports drinks offer valuable electrolytes, sugars, and fluids. °· Breathing heated mist or steam (vaporizer or shower). °· Eating chicken soup or other clear broths, and maintaining good nutrition. °· Getting plenty of rest. °· Using gargles or lozenges for comfort. °· Controlling fevers with ibuprofen or acetaminophen as directed by your caregiver. °· Increasing usage of your inhaler if you have asthma. °Zinc gel and zinc lozenges, taken in the first 24 hours of the common cold, can shorten the duration and lessen the severity of symptoms. Pain medicines may help with fever, muscle aches, and throat pain. A variety of non-prescription medicines are available to treat congestion and runny nose. Your caregiver can make recommendations and may suggest nasal or lung inhalers for other symptoms.  °HOME  CARE INSTRUCTIONS  °· Only take over-the-counter or prescription medicines for pain, discomfort, or fever as directed by your caregiver. °· Use a warm mist humidifier or inhale steam from a shower to increase air moisture. This may keep secretions moist and make it easier to breathe. °· Drink enough water and fluids to keep your urine clear or pale yellow. °· Rest as needed. °· Return to work when your temperature has returned to normal or as your caregiver advises. You may need to stay home longer to avoid infecting others. You can also use a face mask and careful hand washing to prevent spread of the virus. °SEEK MEDICAL CARE IF:  °· After the first few days, you feel you are getting worse rather than better. °· You need your caregiver's advice about medicines to control symptoms. °· You develop chills, worsening shortness of breath, or brown or red sputum. These may be signs of pneumonia. °· You develop yellow or brown nasal discharge or pain in the face, especially when you bend forward. These may be signs of sinusitis. °· You develop a fever, swollen neck glands, pain with swallowing, or white areas in the back of your throat. These may be signs of strep throat. °SEEK IMMEDIATE MEDICAL CARE IF:  °· You have a fever. °· You develop severe or persistent headache, ear   pain, sinus pain, or chest pain. °· You develop wheezing, a prolonged cough, cough up blood, or have a change in your usual mucus (if you have chronic lung disease). °· You develop sore muscles or a stiff neck. °Document Released: 05/30/2001 Document Revised: 02/26/2012 Document Reviewed: 03/11/2014 °ExitCare® Patient Information ©2015 [employer]. This information is not intended to replace advice given to you by your health care provider. Make sure you discuss any questions you have with your health care provider. ° °

## 2014-11-15 NOTE — ED Notes (Addendum)
Patient states he has had a cough for 3 days, states cough is productive and he feels like he is wheezing. Also states he feels chest pressure that started a few days ago as well.

## 2014-11-15 NOTE — ED Provider Notes (Signed)
CSN: [id number]     Arrival date & time 11/15/14  1218 History   First MD Initiated Contact with Patient 11/15/14 1247     Chief Complaint  Patient presents with  . Cough     (Consider location/radiation/quality/duration/timing/severity/associated sxs/prior Treatment) HPI  28 year old male presents with a cough for the past 3 days. He states that his cough is productive with yellow sputum. He feels like his chest is tight and he is having chest congestion and rhinorrhea. No fevers or headaches. No shortness of breath. Patient does not smoke. He has 2 children that are sick with similar symptoms. Patient denies any significant past medical history. He has tried Vicks vapor rub near his nose but no other medicines for his symptoms.  Past Medical History  Diagnosis Date  . Skin abscess    History reviewed. No pertinent past surgical history. No family history on file. History  Substance Use Topics  . Smoking status: Never Smoker   . Smokeless tobacco: Not on file  . Alcohol Use: No    Review of Systems  Constitutional: Negative for fever.  HENT: Positive for congestion and rhinorrhea.   Respiratory: Positive for cough and chest tightness. Negative for shortness of breath.   Gastrointestinal: Negative for vomiting.  All other systems reviewed and are negative.     Allergies  Review of patient's allergies indicates no known allergies.  Home Medications   Prior to Admission medications   Medication Sig Start Date End Date Taking? Authorizing Provider  cephALEXin (KEFLEX) 500 MG capsule Take 1 capsule (500 mg total) by mouth 4 (four) times daily. 08/21/13   Derwood KaplanAnkit Nanavati, MD  fexofenadine-pseudoephedrine (ALLEGRA-D) 60-120 MG per tablet Take 1 tablet by mouth every 12 (twelve) hours. 01/12/14   Rolan BuccoMelanie Belfi, MD  ibuprofen (ADVIL,MOTRIN) 600 MG tablet Take 1 tablet (600 mg total) by mouth every 6 (six) hours as needed for pain. 08/21/13   Derwood KaplanAnkit Nanavati, MD  Multiple Vitamin  (MULITIVITAMIN WITH MINERALS) TABS Take 1 tablet by mouth daily.    Historical Provider, MD  ondansetron (ZOFRAN) 4 MG tablet Take 2 tablets (8 mg total) by mouth every 6 (six) hours. 12/12/12   Doug SouSam Jacubowitz, MD  sulfamethoxazole-trimethoprim (SEPTRA DS) 800-160 MG per tablet Take 1 tablet by mouth every 12 (twelve) hours. 08/21/13   Ankit Rhunette CroftNanavati, MD   BP 122/74 mmHg  Pulse 56  Temp(Src) 98.8 F (37.1 C) (Oral)  Resp 16  Ht 5\' 8"  (1.727 m)  Wt 200 lb (90.719 kg)  BMI 30.42 kg/m2  SpO2 99% Physical Exam  Constitutional: He is oriented to person, place, and time. He appears well-developed and well-nourished.  HENT:  Head: Normocephalic and atraumatic.  Right Ear: External ear normal.  Left Ear: External ear normal.  Nose: Nose normal.  Mouth/Throat: Oropharynx is clear and moist. No oropharyngeal exudate.  Eyes: Right eye exhibits no discharge. Left eye exhibits no discharge.  Neck: Neck supple.  Cardiovascular: Normal rate, regular rhythm, normal heart sounds and intact distal pulses.   Pulmonary/Chest: Effort normal and breath sounds normal. He has no wheezes. He has no rales.  Abdominal: Soft. He exhibits no distension. There is no tenderness.  Musculoskeletal: He exhibits no edema.  Neurological: He is alert and oriented to person, place, and time.  Skin: Skin is warm and dry.  Nursing note and vitals reviewed.   ED Course  Procedures (including critical care time) Labs Review Labs Reviewed - No data to display  Imaging Review Dg Chest 2 View  11/15/2014   CLINICAL DATA:  Productive cough, congestion, wheezing  EXAM: CHEST  2 VIEW  COMPARISON:  06/12/2011  FINDINGS: Lungs are clear.  No pleural effusion or pneumothorax.  The heart is normal in size.  Visualized osseous structures are within normal limits.  IMPRESSION: Normal chest radiographs.   Electronically Signed   By: Charline BillsSriyesh  Krishnan M.D.   On: 11/15/2014 13:34     EKG Interpretation None      MDM   Final  diagnoses:  Upper respiratory infection    Patient with URI symptoms. No wheezing or rales here. CXR negative for PNA. Well appearing, has no hypoxia or increased WOB. Discussed symptomatic care, patient prefers to try OTC meds for cough and will return if symptoms worsen.    Audree CamelScott T Jhace Fennell, MD 11/15/14 [phone]

## 2015-12-08 ENCOUNTER — Emergency Department (HOSPITAL_COMMUNITY): Payer: Self-pay

## 2015-12-08 ENCOUNTER — Emergency Department (HOSPITAL_COMMUNITY)
Admission: EM | Admit: 2015-12-08 | Discharge: 2015-12-08 | Disposition: A | Discharge status: Home / Self Care | Payer: Self-pay | Attending: Emergency Medicine | Admitting: Emergency Medicine

## 2015-12-08 ENCOUNTER — Encounter (HOSPITAL_COMMUNITY): Payer: Self-pay | Admitting: Emergency Medicine

## 2015-12-08 DIAGNOSIS — M79602 Pain in left arm: Secondary | ICD-10-CM | POA: Insufficient documentation

## 2015-12-08 DIAGNOSIS — R251 Tremor, unspecified: Secondary | ICD-10-CM | POA: Insufficient documentation

## 2015-12-08 DIAGNOSIS — R079 Chest pain, unspecified: Secondary | ICD-10-CM | POA: Insufficient documentation

## 2015-12-08 DIAGNOSIS — Z792 Long term (current) use of antibiotics: Secondary | ICD-10-CM | POA: Insufficient documentation

## 2015-12-08 DIAGNOSIS — M542 Cervicalgia: Secondary | ICD-10-CM | POA: Insufficient documentation

## 2015-12-08 DIAGNOSIS — Z872 Personal history of diseases of the skin and subcutaneous tissue: Secondary | ICD-10-CM | POA: Insufficient documentation

## 2015-12-08 DIAGNOSIS — Z79899 Other long term (current) drug therapy: Secondary | ICD-10-CM | POA: Insufficient documentation

## 2015-12-08 LAB — BASIC METABOLIC PANEL
Anion gap: 8 (ref 5–15)
BUN: 9 mg/dL (ref 6–20)
CHLORIDE: 107 mmol/L (ref 101–111)
CO2: 25 mmol/L (ref 22–32)
CREATININE: 1.07 mg/dL (ref 0.61–1.24)
Calcium: 9 mg/dL (ref 8.9–10.3)
GFR calc Af Amer: >60 mL/min (ref >60)
GFR calc non Af Amer: >60 mL/min (ref >60)
GLUCOSE: 102 mg/dL — AB (ref 65–99)
Potassium: 4.8 mmol/L (ref 3.5–5.1)
SODIUM: 140 mmol/L (ref 135–145)

## 2015-12-08 LAB — CBC WITH DIFFERENTIAL/PLATELET
Basophils Absolute: 0 10*3/uL (ref 0.0–0.1)
Basophils Relative: 0 %
Eosinophils Absolute: 0.1 10*3/uL (ref 0.0–0.7)
Eosinophils Relative: 1 %
HEMATOCRIT: 42.8 % (ref 39.0–52.0)
HEMOGLOBIN: 14.7 g/dL (ref 13.0–17.0)
LYMPHS ABS: 3.3 10*3/uL (ref 0.7–4.0)
LYMPHS PCT: 47 %
MCH: 27.9 pg (ref 26.0–34.0)
MCHC: 34.3 g/dL (ref 30.0–36.0)
MCV: 81.2 fL (ref 78.0–100.0)
MONO ABS: 0.7 10*3/uL (ref 0.1–1.0)
MONOS PCT: 10 %
NEUTROS ABS: 2.9 10*3/uL (ref 1.7–7.7)
NEUTROS PCT: 42 %
Platelets: 244 10*3/uL (ref 150–400)
RBC: 5.27 MIL/uL (ref 4.22–5.81)
RDW: 13.4 % (ref 11.5–15.5)
WBC: 7 10*3/uL (ref 4.0–10.5)

## 2015-12-08 LAB — I-STAT TROPONIN, ED: TROPONIN I, POC: 0 ng/mL (ref 0.00–0.08)

## 2015-12-08 MED ORDER — ASPIRIN 81 MG PO CHEW
324.0000 mg | CHEWABLE_TABLET | Freq: Once | ORAL | Status: AC
  Administered 2015-12-08: 324 mg via ORAL
  Filled 2015-12-08: qty 4

## 2015-12-08 NOTE — ED Notes (Signed)
Pt left at this time with all belongings. Refused wheelchair. 

## 2015-12-08 NOTE — ED Provider Notes (Signed)
CSN: [id number]     Arrival date & time 12/08/15  0003 History   By signing my name below, I, Arlan Organshley Leger, attest that this documentation has been prepared under the direction and in the presence of Zadie Rhineonald Shireen Rayburn, MD.  Electronically Signed: Arlan OrganAshley Leger, ED Scribe. 12/08/2015. 12:31 AM.   Chief Complaint  Patient presents with  . Chest Pain   The history is provided by the patient. No language interpreter was used.    HPI Comments: Craig Wheeler is a 29 y.o. male without any pertinent past medical history who presents to the Emergency Department complaining of constant, ongoing neck pain x 30 minutes prior to arrival while helping move furniture. Pt described pain as "locking up". He also reports stiffness to the left arm along with substernal chest pain and shakiness. No aggravating or alleviating factors at this time. No OTC medications or home remedies attempted prior to arrival. No recent fever, chills, shortness of breath, diaphoresis, nausea, vomiting, abdominal pain, back pain. He is not an every day smoker. Denies any illicit drug use. No recent long distance travel. Pt denies any past surgical history. No hemoptysis No h/o CAD/PE/DVT  PMH - none Soc hx - denies drug use  Past Medical History  Diagnosis Date  . Skin abscess    No past surgical history on file. No family history on file. Social History  Substance Use Topics  . Smoking status: Never Smoker   . Smokeless tobacco: Not on file  . Alcohol Use: No    Review of Systems  Constitutional: Negative for fever, chills and diaphoresis.  Respiratory: Negative for cough and shortness of breath.   Cardiovascular: Positive for chest pain.  Gastrointestinal: Negative for nausea, vomiting, abdominal pain and diarrhea.  Musculoskeletal: Positive for arthralgias and neck pain. Negative for back pain.  Neurological: Negative for headaches.  Psychiatric/Behavioral: Negative for confusion.  All other systems reviewed  and are negative.     Allergies  Review of patient's allergies indicates no known allergies.  Home Medications   Prior to Admission medications   Medication Sig Start Date End Date Taking? Authorizing Provider  cephALEXin (KEFLEX) 500 MG capsule Take 1 capsule (500 mg total) by mouth 4 (four) times daily. 08/21/13   Derwood KaplanAnkit Nanavati, MD  fexofenadine-pseudoephedrine (ALLEGRA-D) 60-120 MG per tablet Take 1 tablet by mouth every 12 (twelve) hours. 01/12/14   Rolan BuccoMelanie Belfi, MD  ibuprofen (ADVIL,MOTRIN) 600 MG tablet Take 1 tablet (600 mg total) by mouth every 6 (six) hours as needed for pain. 08/21/13   Derwood KaplanAnkit Nanavati, MD  Multiple Vitamin (MULITIVITAMIN WITH MINERALS) TABS Take 1 tablet by mouth daily.    Historical Provider, MD  ondansetron (ZOFRAN) 4 MG tablet Take 2 tablets (8 mg total) by mouth every 6 (six) hours. 12/12/12   Doug SouSam Jacubowitz, MD  sulfamethoxazole-trimethoprim (SEPTRA DS) 800-160 MG per tablet Take 1 tablet by mouth every 12 (twelve) hours. 08/21/13   Derwood KaplanAnkit Nanavati, MD   Triage Vitals: BP 151/88 mmHg  Pulse 78  Temp(Src) 98 F (36.7 C) (Oral)  Resp 16  Ht 5\' 8"  (1.727 m)  Wt 197 lb (89.359 kg)  BMI 29.96 kg/m2  SpO2 98%   Physical Exam  CONSTITUTIONAL: Well developed/well nourished HEAD: Normocephalic/atraumatic EYES: EOMI/PERRL ENMT: Mucous membranes moist NECK: supple no meningeal signs. No carotid bruits  SPINE/BACK:entire spine nontender CV: S1/S2 noted, no murmurs/rubs/gallops noted LUNGS: Lungs are clear to auscultation bilaterally, no apparent distress ABDOMEN: soft, nontender, no rebound or guarding, bowel sounds noted throughout  abdomen GU:no cva tenderness NEURO: Pt is awake/alert/appropriate, moves all extremitiesx4.  No facial droop.  No arm or leg drift. Equal hand grips. Equal power in all 4 extremities. No sensory deficits noted. EXTREMITIES: pulses normal/equal, full ROM. No calf tenderness or edema  SKIN: warm, color normal PSYCH: Flat affect     ED Course  Procedures   DIAGNOSTIC STUDIES: Oxygen Saturation is 98% on RA, Normal by my interpretation.    COORDINATION OF CARE: 12:27 AM- Will give ASA. Will order CXR, BMP, i-stat troponin, CBC, and EKG. Discussed treatment plan with pt at bedside and pt agreed to plan.    pt is currently PERC negative  HEART score at 3 He reports both neck and chest pain.  Currently no focal neuro deficits.  He is stable at this time Will monitor in the ED  After monitoring in the ED, pt requested d/c home He reported he felt improved He had no neck pain, no chest pain and no other pain He has no complaints He wants to go home Advised that we can r/o ACS with one ekg/troponin/cxr Pt understands this limitation He prefers to go home We discussed strict ER return precautions Advised he can return at anytime  Also - pt awake/alert, no distress, no focal weakness noted, he is comfortable appearing resting in bed BP 122/81 mmHg  Pulse 63  Temp(Src) 98 F (36.7 C) (Oral)  Resp 22  Ht  (1.727 m)  Wt 89.359 kg  BMI 29.96 kg/m2  SpO2 98%   Labs Review Labs Reviewed  BASIC METABOLIC PANEL - Abnormal; Notable for the following:    Glucose, Bld 102 (*)    All other components within normal limits  CBC WITH DIFFERENTIAL/PLATELET  Rosezena Sensor, ED    Imaging Review Dg Chest 2 View  12/08/2015  CLINICAL DATA:  29 year old male with chest pain and tightness EXAM: CHEST  2 VIEW COMPARISON:  Radiograph dated 11/15/2014 FINDINGS: The heart size and mediastinal contours are within normal limits. Both lungs are clear. The visualized skeletal structures are unremarkable. IMPRESSION: No active cardiopulmonary disease. Electronically Signed   By: Elgie Collard M.D.   On: 12/08/2015 01:03   I have personally reviewed and evaluated these images and lab results as part of my medical decision-making.   EKG Interpretation   Date/Time:  Wednesday December 08 2015 00:23:27 EST Ventricular  Rate:  80 PR Interval:  147 QRS Duration: 94 QT Interval:  361 QTC Calculation: 416 R Axis:   87 Text Interpretation:  Sinus rhythm Consider left atrial enlargement ST  elev, probable normal early repol pattern No significant change since last  tracing Confirmed by Bebe Shaggy  MD, Shaw Dobek ([id number]) on 12/08/2015 12:44:40  AM     Medications  aspirin chewable tablet 324 mg (324 mg Oral Given 12/08/15 0046)    MDM   Final diagnoses:  Chest pain, unspecified chest pain type  Neck pain    Nursing notes including past medical history and social history reviewed and considered in documentation xrays/imaging reviewed by myself and considered during evaluation Labs/vital reviewed myself and considered during evaluation   I personally performed the services described in this documentation, which was scribed in my presence. The recorded information has been reviewed and is accurate.       Zadie Rhine, MD 12/08/15 [phone]

## 2015-12-08 NOTE — ED Notes (Signed)
Pt arrives with c/o back pain, chest pain, throat closing up, full body numbness ??? ongoing 30 minutes. Pt speaking in low voice, difficult to understand. MD at bedside

## 2015-12-08 NOTE — ED Provider Notes (Signed)
MSE was initiated and I personally evaluated the patient and placed orders (if any) at  12:12 AM on December 08, 2015.   Pt here with sudden onset CP in center of chest, radiating to L arm and neck, started PTA. Given complaint, pt upgraded and moved to another room.  The patient appears stable so that the remainder of the MSE may be completed by another provider.  Allen DerryMercedes Camprubi-Soms, PA-C 12/08/15 0013  April Palumbo, MD 12/08/15 [phone]

## 2015-12-08 NOTE — Discharge Instructions (Signed)
Your caregiver has diagnosed you as having chest pain that is not specific for one problem, but does not require admission.  Chest pain comes from many different causes.  °SEEK IMMEDIATE MEDICAL ATTENTION IF: °You have severe chest pain, especially if the pain is crushing or pressure-like and spreads to the arms, back, neck, or jaw, or if you have sweating, nausea (feeling sick to your stomach), or shortness of breath. THIS IS AN EMERGENCY. Don't wait to see if the pain will go away. Get medical help at once. Call 911 or 0 (operator). DO NOT drive yourself to the hospital.  °Your chest pain gets worse and does not go away with rest.  °You have an attack of chest pain lasting longer than usual, despite rest and treatment with the medications your caregiver has prescribed.  °You wake from sleep with chest pain or shortness of breath.  °You feel dizzy or faint.  °You have chest pain not typical of your usual pain for which you originally saw your caregiver. ° °You have neck pain, possibly from a cervical strain and/or pinched nerve.  ° °SEEK IMMEDIATE MEDICAL ATTENTION IF: °You develop difficulties swallowing or breathing.  °You have new or worse numbness, weakness, tingling, or movement problems in your arms or legs.  °You develop increasing pain which is uncontrolled with medications.  °You have change in bowel or bladder function, or other concerns. ° °

## 2016-03-03 ENCOUNTER — Emergency Department (HOSPITAL_BASED_OUTPATIENT_CLINIC_OR_DEPARTMENT_OTHER)
Admission: EM | Admit: 2016-03-03 | Discharge: 2016-03-03 | Disposition: A | Discharge status: Home / Self Care | Payer: Self-pay | Attending: Emergency Medicine | Admitting: Emergency Medicine

## 2016-03-03 ENCOUNTER — Encounter (HOSPITAL_BASED_OUTPATIENT_CLINIC_OR_DEPARTMENT_OTHER): Payer: Self-pay | Admitting: Emergency Medicine

## 2016-03-03 DIAGNOSIS — Z872 Personal history of diseases of the skin and subcutaneous tissue: Secondary | ICD-10-CM | POA: Insufficient documentation

## 2016-03-03 DIAGNOSIS — J020 Streptococcal pharyngitis: Secondary | ICD-10-CM | POA: Insufficient documentation

## 2016-03-03 LAB — RAPID STREP SCREEN (MED CTR MEBANE ONLY): Streptococcus, Group A Screen (Direct): POSITIVE — AB

## 2016-03-03 MED ORDER — PENICILLIN G BENZATHINE 1200000 UNIT/2ML IM SUSP
1.2000 10*6.[IU] | Freq: Once | INTRAMUSCULAR | Status: AC
  Administered 2016-03-03: 1.2 10*6.[IU] via INTRAMUSCULAR
  Filled 2016-03-03: qty 2

## 2016-03-03 MED ORDER — DEXAMETHASONE 1 MG/ML PO CONC
10.0000 mg | Freq: Once | ORAL | Status: AC
  Administered 2016-03-03: 10 mg via ORAL
  Filled 2016-03-03: qty 1

## 2016-03-03 NOTE — ED Notes (Signed)
Sore throat x 3 days

## 2016-03-03 NOTE — ED Provider Notes (Signed)
CSN: [id number]     Arrival date & time 03/03/16  1545 History   First MD Initiated Contact with Patient 03/03/16 1628     Chief Complaint  Patient presents with  . Sore Throat     Patient is a 30 y.o. male presenting with pharyngitis. The history is provided by the patient. No language interpreter was used.  Sore Throat   Craig Wheeler is a 30 y.o. male who presents to the Emergency Department complaining of sore throat.  He reports 2 days of mild sore throat with a fever 2 days ago. The sore throat has significantly worsened today. He has pain with swallowing and feels like his voice is changed. Denies any chest pain, cough, abdominal pain, vomiting. He has no medical problems. Symptoms are severe, constant, worsening.  Past Medical History  Diagnosis Date  . Skin abscess    History reviewed. No pertinent past surgical history. No family history on file. Social History  Substance Use Topics  . Smoking status: Never Smoker   . Smokeless tobacco: None  . Alcohol Use: Yes     Comment: occ    Review of Systems  All other systems reviewed and are negative.     Allergies  Review of patient's allergies indicates no known allergies.  Home Medications   Prior to Admission medications   Not on File   BP 145/93 mmHg  Pulse 74  Temp(Src) 99.2 F (37.3 C) (Oral)  Resp 16  Ht 5\' 8"  (1.727 m)  Wt 210 lb (95.255 kg)  BMI 31.94 kg/m2  SpO2 100% Physical Exam  Constitutional: He is oriented to person, place, and time. He appears well-developed and well-nourished.  HENT:  Head: Normocephalic and atraumatic.  Moderate to severe erythema in the posterior oropharynx with mild to moderate diffuse edema. No peritonsillar abscess. No exudate.  Neck: Neck supple.  Mild bilateral anterior cervical lymphadenopathy.  Cardiovascular: Normal rate and regular rhythm.   No murmur heard. Pulmonary/Chest: Effort normal and breath sounds normal. No respiratory distress.  No stridor   Neurological: He is alert and oriented to person, place, and time.  Skin: Skin is warm and dry.  Psychiatric: He has a normal mood and affect. His behavior is normal.  Nursing note and vitals reviewed.   ED Course  Procedures (including critical care time) Labs Review Labs Reviewed  RAPID STREP SCREEN (NOT AT [facility]) - Abnormal; Notable for the following:    Streptococcus, Group A Screen (Direct) POSITIVE (*)    All other components within normal limits    Imaging Review No results found. I have personally reviewed and evaluated these images and lab results as part of my medical decision-making.   EKG Interpretation None      MDM   Final diagnoses:  Strep pharyngitis    Patient here for evaluation of sore throat. He has significant posterior oropharyngeal erythema. He has no respiratory distress. He has no difficulty swallowing. He is nontoxic appearing on examination. Presentation is not consistent with PTA, RPA, epiglottitis. Strep positive. Treating for strep pharyngitis. Providing one-time dose of Decadron for symptomatically relief. Discussed home care as well as very close return precautions for evidence of progressive worsening symptoms.    Tilden FossaElizabeth Hiawatha Merriott, MD 03/04/16 [phone]

## 2016-03-03 NOTE — Discharge Instructions (Signed)

## 2018-04-11 ENCOUNTER — Emergency Department (HOSPITAL_BASED_OUTPATIENT_CLINIC_OR_DEPARTMENT_OTHER)
Admission: EM | Admit: 2018-04-11 | Discharge: 2018-04-11 | Disposition: A | Discharge status: Home / Self Care | Payer: Self-pay | Attending: Emergency Medicine | Admitting: Emergency Medicine

## 2018-04-11 ENCOUNTER — Encounter (HOSPITAL_BASED_OUTPATIENT_CLINIC_OR_DEPARTMENT_OTHER): Payer: Self-pay

## 2018-04-11 ENCOUNTER — Other Ambulatory Visit: Payer: Self-pay

## 2018-04-11 DIAGNOSIS — Z113 Encounter for screening for infections with a predominantly sexual mode of transmission: Secondary | ICD-10-CM | POA: Insufficient documentation

## 2018-04-11 DIAGNOSIS — R369 Urethral discharge, unspecified: Secondary | ICD-10-CM | POA: Insufficient documentation

## 2018-04-11 MED ORDER — CEFTRIAXONE SODIUM 250 MG IJ SOLR
250.0000 mg | Freq: Once | INTRAMUSCULAR | Status: AC
  Administered 2018-04-11: 250 mg via INTRAMUSCULAR
  Filled 2018-04-11: qty 250

## 2018-04-11 MED ORDER — METRONIDAZOLE 500 MG PO TABS
2000.0000 mg | ORAL_TABLET | Freq: Once | ORAL | Status: AC
  Administered 2018-04-11: 2000 mg via ORAL
  Filled 2018-04-11: qty 4

## 2018-04-11 MED ORDER — AZITHROMYCIN 250 MG PO TABS
1000.0000 mg | ORAL_TABLET | Freq: Once | ORAL | Status: AC
  Administered 2018-04-11: 1000 mg via ORAL
  Filled 2018-04-11: qty 4

## 2018-04-11 NOTE — ED Triage Notes (Signed)
C/o penile d/c and dysuria x 2 days-NAD-steady gait

## 2018-04-11 NOTE — ED Provider Notes (Signed)
[facility] EMERGENCY DEPARTMENT Provider Note   CSN: [id number] Arrival date & time: 04/11/18  1857     History   Chief Complaint Chief Complaint  Patient presents with  . Penile Discharge    HPI Craig Wheeler is a 32 y.o. male.  32 yo M with a chief complaint of penile discharge.  The patient had a unprotected sexual encounter about 2 days ago.  Is been having dysuria and discharge since.  He denies fevers or chills denies abdominal pain denies testicular pain.  Denies rash.  Denies prior STD.  The history is provided by the patient.  Penile Discharge  This is a new problem. The current episode started 2 days ago. The problem occurs constantly. The problem has been gradually worsening. Pertinent negatives include no chest pain, no abdominal pain, no headaches and no shortness of breath. Nothing aggravates the symptoms. Nothing relieves the symptoms. He has tried nothing for the symptoms. The treatment provided no relief.    Past Medical History:  Diagnosis Date  . Skin abscess     There are no active problems to display for this patient.   History reviewed. No pertinent surgical history.      Home Medications    Prior to Admission medications   Not on File    Family History No family history on file.  Social History Social History   Tobacco Use  . Smoking status: Current Every Day Smoker  . Smokeless tobacco: Never Used  Substance Use Topics  . Alcohol use: Yes    Comment: occ  . Drug use: No     Allergies   Patient has no known allergies.   Review of Systems Review of Systems  Constitutional: Negative for chills and fever.  HENT: Negative for congestion and facial swelling.   Eyes: Negative for discharge and visual disturbance.  Respiratory: Negative for shortness of breath.   Cardiovascular: Negative for chest pain and palpitations.  Gastrointestinal: Negative for abdominal pain, diarrhea and vomiting.  Genitourinary: Positive  for discharge and dysuria.  Musculoskeletal: Negative for arthralgias and myalgias.  Skin: Negative for color change and rash.  Neurological: Negative for tremors, syncope and headaches.  Psychiatric/Behavioral: Negative for confusion and dysphoric mood.     Physical Exam Updated Vital Signs BP (!) 143/96 (BP Location: Left Arm)   Pulse 72   Temp 98.4 F (36.9 C) (Oral)   Resp 18   Ht 5\' 8"  (1.727 m)   Wt 88.6 kg (195 lb 5.2 oz)   SpO2 98%   BMI 29.70 kg/m   Physical Exam  Constitutional: He is oriented to person, place, and time. He appears well-developed and well-nourished.  HENT:  Head: Normocephalic and atraumatic.  Eyes: Pupils are equal, round, and reactive to light. EOM are normal.  Neck: Normal range of motion. Neck supple. No JVD present.  Cardiovascular: Normal rate and regular rhythm. Exam reveals no gallop and no friction rub.  No murmur heard. Pulmonary/Chest: No respiratory distress. He has no wheezes.  Abdominal: He exhibits no distension. There is no rebound and no guarding.  Genitourinary:  Genitourinary Comments: Purulent drainage at the urethra.  No testicular pain no rash uncircumcised  Musculoskeletal: Normal range of motion.  Neurological: He is alert and oriented to person, place, and time.  Skin: No rash noted. No pallor.  Psychiatric: He has a normal mood and affect. His behavior is normal.  Nursing note and vitals reviewed.    ED Treatments / Results  Labs (  all labs ordered are listed, but only abnormal results are displayed) Labs Reviewed  RPR  HIV ANTIBODY (ROUTINE TESTING)  GC/CHLAMYDIA PROBE AMP ([place]) NOT AT [facility]    EKG None  Radiology No results found.  Procedures Procedures (including critical care time)  Medications Ordered in ED Medications  cefTRIAXone (ROCEPHIN) injection 250 mg (has no administration in time range)  azithromycin (ZITHROMAX) tablet 1,000 mg (has no administration in time range)  metroNIDAZOLE  (FLAGYL) tablet 2,000 mg (has no administration in time range)     Initial Impression / Assessment and Plan / ED Course  I have reviewed the triage vital signs and the nursing notes.  Pertinent labs & imaging results that were available during my care of the patient were reviewed by me and considered in my medical decision making (see chart for details).     32 yo M with likely sexual transmitted disease.  Will treat presumptively.  Urethral swab sent off for GC we will test for HIV and syphilis.  7:37 PM:  I have discussed the diagnosis/risks/treatment options with the patient and believe the pt to be eligible for discharge home to follow-up with PCP. We also discussed returning to the ED immediately if new or worsening sx occur. We discussed the sx which are most concerning (e.g., sudden worsening pain, fever, inability to tolerate by mouth) that necessitate immediate return. Medications administered to the patient during their visit and any new prescriptions provided to the patient are listed below.  Medications given during this visit Medications  cefTRIAXone (ROCEPHIN) injection 250 mg (has no administration in time range)  azithromycin (ZITHROMAX) tablet 1,000 mg (has no administration in time range)  metroNIDAZOLE (FLAGYL) tablet 2,000 mg (has no administration in time range)      The patient appears reasonably screen and/or stabilized for discharge and I doubt any other medical condition or other [facility] requiring further screening, evaluation, or treatment in the ED at this time prior to discharge.    Final Clinical Impressions(s) / ED Diagnoses   Final diagnoses:  Penile discharge    ED Discharge Orders    None       Melene PlanFloyd, Silvestre Mines, DO 04/11/18 [phone]

## 2018-04-12 LAB — GC/CHLAMYDIA PROBE AMP (~~LOC~~) NOT AT ARMC
CHLAMYDIA, DNA PROBE: POSITIVE — AB
Neisseria Gonorrhea: POSITIVE — AB

## 2018-04-13 LAB — HIV ANTIBODY (ROUTINE TESTING W REFLEX): HIV SCREEN 4TH GENERATION: NONREACTIVE

## 2018-04-13 LAB — RPR: RPR Ser Ql: NONREACTIVE

## 2018-10-26 ENCOUNTER — Encounter (HOSPITAL_COMMUNITY): Payer: Self-pay

## 2018-10-26 ENCOUNTER — Other Ambulatory Visit: Payer: Self-pay

## 2018-10-26 ENCOUNTER — Emergency Department (HOSPITAL_COMMUNITY): Payer: Self-pay

## 2018-10-26 ENCOUNTER — Emergency Department (HOSPITAL_COMMUNITY)
Admission: EM | Admit: 2018-10-26 | Discharge: 2018-10-26 | Disposition: A | Discharge status: Home / Self Care | Payer: Self-pay | Attending: Emergency Medicine | Admitting: Emergency Medicine

## 2018-10-26 DIAGNOSIS — R059 Cough, unspecified: Secondary | ICD-10-CM

## 2018-10-26 DIAGNOSIS — R05 Cough: Secondary | ICD-10-CM

## 2018-10-26 DIAGNOSIS — F17210 Nicotine dependence, cigarettes, uncomplicated: Secondary | ICD-10-CM | POA: Insufficient documentation

## 2018-10-26 DIAGNOSIS — J069 Acute upper respiratory infection, unspecified: Secondary | ICD-10-CM

## 2018-10-26 LAB — INFLUENZA PANEL BY PCR (TYPE A & B)
INFLAPCR: NEGATIVE
Influenza B By PCR: NEGATIVE

## 2018-10-26 LAB — GROUP A STREP BY PCR: GROUP A STREP BY PCR: NOT DETECTED

## 2018-10-26 MED ORDER — BENZONATATE 100 MG PO CAPS: 100.0000 mg | ORAL_CAPSULE | Freq: Three times a day (TID) | ORAL | 0 refills | Status: DC | PRN

## 2018-10-26 NOTE — ED Provider Notes (Signed)
[facility] EMERGENCY DEPARTMENT Provider Note   CSN: [id number] Arrival date & time: 10/26/18  1345     History   Chief Complaint Chief Complaint  Patient presents with  . Cough    HPI Craig Wheeler is a 32 y.o. male.  The history is provided by the patient, a significant other and medical records.  Cough  This is a new problem. The current episode started more than 1 week ago. The problem occurs constantly. The problem has not changed since onset.The cough is productive of sputum. Maximum temperature: subjective. Associated symptoms include rhinorrhea. Pertinent negatives include no chest pain, no chills, no headaches, no shortness of breath (at times) and no wheezing. He has tried nothing for the symptoms. The treatment provided no relief. Risk factors: sick contacs.    Past Medical History:  Diagnosis Date  . Skin abscess     There are no active problems to display for this patient.   History reviewed. No pertinent surgical history.      Home Medications    Prior to Admission medications   Not on File    Family History History reviewed. No pertinent family history.  Social History Social History   Tobacco Use  . Smoking status: Current Every Day Smoker    Packs/day: 0.50    Types: Cigarettes  . Smokeless tobacco: Never Used  Substance Use Topics  . Alcohol use: Yes    Comment: occ  . Drug use: No     Allergies   Patient has no known allergies.   Review of Systems Review of Systems  Constitutional: Positive for fatigue. Negative for chills, diaphoresis and fever.  HENT: Positive for congestion and rhinorrhea.   Eyes: Negative for visual disturbance.  Respiratory: Positive for cough. Negative for chest tightness, shortness of breath (at times), wheezing and stridor.   Cardiovascular: Negative for chest pain, palpitations and leg swelling.  Gastrointestinal: Negative for constipation, diarrhea, nausea and vomiting.    Genitourinary: Negative for flank pain.  Musculoskeletal: Negative for back pain, neck pain and neck stiffness.  Skin: Negative for rash and wound.  Neurological: Negative for light-headedness, numbness and headaches.  Psychiatric/Behavioral: Negative for agitation.  All other systems reviewed and are negative.    Physical Exam Updated Vital Signs BP (!) 130/92 (BP Location: Right Arm)   Pulse 77   Temp 98.3 F (36.8 C) (Oral)   Resp 16   Ht 5\' 8"  (1.727 m)   Wt 89.8 kg   SpO2 98%   BMI 30.11 kg/m   Physical Exam  Constitutional: He is oriented to person, place, and time. He appears well-developed and well-nourished. No distress.  HENT:  Head: Normocephalic and atraumatic.  Nose: Rhinorrhea present.  Eyes: Conjunctivae are normal.  Neck: Neck supple.  Cardiovascular: Normal rate, regular rhythm and intact distal pulses.  No murmur heard. Pulmonary/Chest: Effort normal. No respiratory distress. He has no wheezes. He has rhonchi (faint). He exhibits no tenderness.  Abdominal: Soft. There is no tenderness.  Musculoskeletal: He exhibits no edema or tenderness.  Neurological: He is alert and oriented to person, place, and time. No sensory deficit. He exhibits normal muscle tone.  Skin: Skin is warm and dry. Capillary refill takes less than 2 seconds. He is not diaphoretic. No erythema. No pallor.  Psychiatric: He has a normal mood and affect.  Nursing note and vitals reviewed.    ED Treatments / Results  Labs (all labs ordered are listed, but only abnormal results are  displayed) Labs Reviewed  GROUP A STREP BY PCR  INFLUENZA PANEL BY PCR (TYPE A & B)    EKG None  Radiology Dg Chest 2 View  Result Date: 10/26/2018 CLINICAL DATA:  Shortness of breath.  Cough. EXAM: CHEST - 2 VIEW COMPARISON:  Chest radiograph 12/08/2015 FINDINGS: Normal cardiac and mediastinal contours. No consolidative pulmonary opacities. No pleural effusion or pneumothorax. Thoracic spine  degenerative changes. IMPRESSION: No acute cardiopulmonary process. Electronically Signed   By: Annia Belt M.D.   On: 10/26/2018 15:26    Procedures Procedures (including critical care time)  Medications Ordered in ED Medications - No data to display   Initial Impression / Assessment and Plan / ED Course  I have reviewed the triage vital signs and the nursing notes.  Pertinent labs & imaging results that were available during my care of the patient were reviewed by me and considered in my medical decision making (see chart for details).     Craig Wheeler is a 32 y.o. male with no significant past medical history who presents with productive cough for several weeks.  Patient reports that for the last 3 weeks he has had persistent cough with occasional shortness of breath and a production of a white and yellow sputum.  He says that he has not had any chest pain or chest tightness.  He denies abdominal pain, nausea, vomiting, or urinary symptoms or he does report he has had occasional diarrhea and constipation.  Of note, patient says that his son is admitted in this hospital right now for RSV and influenza causing respiratory problems.  He reports that he has had some rhinorrhea and congestion as well.  On exam, patient had some coarseness in his breath sounds.  Patient had some rhinorrhea present.  No stridor.  Chest was nontender.  Back was nontender.  Patient resting comfortably.  Patient is afebrile.    Clinically I suspect patient has a viral infection either RSV versus influenza however with his productive cough for several weeks, will obtain chest x-ray to make sure he does not have a post viral pneumonia.  Patient will also have a strep test and flu test.  Anticipate reassessment after work-up.  4:02 PM X-ray shows no evidence of pneumonia.  Flu test and strep test negative.  Next  Patient likely has viral infection, patient will be discharged home with Tessalon.  Patient will  follow with PCP and understood return precautions.  Patient with questions or concerns and was discharged in good condition.  Final Clinical Impressions(s) / ED Diagnoses   Final diagnoses:  Cough  Upper respiratory tract infection, unspecified type    ED Discharge Orders         Ordered    benzonatate (TESSALON) 100 MG capsule  3 times daily PRN     10/26/18 1558          Clinical Impression: 1. Cough   2. Upper respiratory tract infection, unspecified type     Disposition: Discharge  Condition: Good  I have discussed the results, Dx and Tx plan with the pt(& family if present). He/she/they expressed understanding and agree(s) with the plan. Discharge instructions discussed at great length. Strict return precautions discussed and pt &/or family have verbalized understanding of the instructions. No further questions at time of discharge.    New Prescriptions   BENZONATATE (TESSALON) 100 MG CAPSULE    Take 1 capsule (100 mg total) by mouth 3 (three) times daily as needed for cough.  Follow Up: [facility] [address] [place] [address] [phone] Schedule an appointment as soon as possible for a visit    [facility] EMERGENCY DEPARTMENT [address] [id number] m[place] [address] [phone]       Tegeler, Canary Brim, MD 10/26/18 [phone]

## 2018-10-26 NOTE — ED Triage Notes (Signed)
Pt endorses productive cough with white/yellow sputum x several weeks. Denies chills or bodyaches. No n/v/d. VSS

## 2018-10-26 NOTE — ED Notes (Signed)
Pt moved to hallway with mask on.

## 2018-10-26 NOTE — ED Notes (Signed)
Patient able to ambulate independently  

## 2018-10-26 NOTE — Discharge Instructions (Signed)
Your lab test and x-ray today were reassuring.  There is no evidence of pneumonia.  I suspect you have a viral infection causing your symptoms.  Please use the cough medicine we prescribed and follow-up with your doctor.  If any symptoms change or worsen, please return to the nearest emergency department.

## 2019-01-12 ENCOUNTER — Emergency Department (HOSPITAL_COMMUNITY)
Admission: EM | Admit: 2019-01-12 | Discharge: 2019-01-12 | Disposition: A | Discharge status: Home / Self Care | Payer: Self-pay | Attending: Emergency Medicine | Admitting: Emergency Medicine

## 2019-01-12 ENCOUNTER — Encounter (HOSPITAL_COMMUNITY): Payer: Self-pay

## 2019-01-12 ENCOUNTER — Other Ambulatory Visit: Payer: Self-pay

## 2019-01-12 DIAGNOSIS — F17210 Nicotine dependence, cigarettes, uncomplicated: Secondary | ICD-10-CM | POA: Insufficient documentation

## 2019-01-12 DIAGNOSIS — J101 Influenza due to other identified influenza virus with other respiratory manifestations: Secondary | ICD-10-CM | POA: Insufficient documentation

## 2019-01-12 LAB — COMPREHENSIVE METABOLIC PANEL
ALBUMIN: 4.3 g/dL (ref 3.5–5.0)
ALT: 18 U/L (ref 0–44)
ANION GAP: 6 (ref 5–15)
AST: 21 U/L (ref 15–41)
Alkaline Phosphatase: 46 U/L (ref 38–126)
BILIRUBIN TOTAL: 0.9 mg/dL (ref 0.3–1.2)
BUN: 13 mg/dL (ref 6–20)
CALCIUM: 8.7 mg/dL — AB (ref 8.9–10.3)
CHLORIDE: 109 mmol/L (ref 98–111)
CO2: 25 mmol/L (ref 22–32)
CREATININE: 1.21 mg/dL (ref 0.61–1.24)
GFR calc Af Amer: >60 mL/min (ref >60)
Glucose, Bld: 95 mg/dL (ref 70–99)
POTASSIUM: 3.9 mmol/L (ref 3.5–5.1)
Sodium: 140 mmol/L (ref 135–145)
TOTAL PROTEIN: 7.7 g/dL (ref 6.5–8.1)

## 2019-01-12 LAB — CBC WITH DIFFERENTIAL/PLATELET
Abs Immature Granulocytes: 0.01 10*3/uL (ref 0.00–0.07)
BASOS PCT: 1 %
Basophils Absolute: 0 10*3/uL (ref 0.0–0.1)
EOS ABS: 0 10*3/uL (ref 0.0–0.5)
EOS PCT: 0 %
HCT: 46.9 % (ref 39.0–52.0)
HEMOGLOBIN: 15.6 g/dL (ref 13.0–17.0)
Immature Granulocytes: 0 %
Lymphocytes Relative: 16 %
Lymphs Abs: 0.6 10*3/uL — ABNORMAL LOW (ref 0.7–4.0)
MCH: 27.3 pg (ref 26.0–34.0)
MCHC: 33.3 g/dL (ref 30.0–36.0)
MCV: 82.1 fL (ref 80.0–100.0)
MONO ABS: 0.6 10*3/uL (ref 0.1–1.0)
Monocytes Relative: 17 %
Neutro Abs: 2.5 10*3/uL (ref 1.7–7.7)
Neutrophils Relative %: 66 %
Platelets: 194 10*3/uL (ref 150–400)
RBC: 5.71 MIL/uL (ref 4.22–5.81)
RDW: 14.3 % (ref 11.5–15.5)
WBC: 3.7 10*3/uL — ABNORMAL LOW (ref 4.0–10.5)
nRBC: 0 % (ref 0.0–0.2)

## 2019-01-12 LAB — URINALYSIS, ROUTINE W REFLEX MICROSCOPIC
BILIRUBIN URINE: NEGATIVE
Glucose, UA: NEGATIVE mg/dL
HGB URINE DIPSTICK: NEGATIVE
KETONES UR: NEGATIVE mg/dL
Leukocytes, UA: NEGATIVE
Nitrite: NEGATIVE
Protein, ur: NEGATIVE mg/dL
Specific Gravity, Urine: 1.017 (ref 1.005–1.030)
pH: 6 (ref 5.0–8.0)

## 2019-01-12 LAB — LACTIC ACID, PLASMA
LACTIC ACID, VENOUS: 0.8 mmol/L (ref 0.5–1.9)
Lactic Acid, Venous: 0.7 mmol/L (ref 0.5–1.9)

## 2019-01-12 LAB — INFLUENZA PANEL BY PCR (TYPE A & B)
INFLAPCR: NEGATIVE
Influenza B By PCR: POSITIVE — AB

## 2019-01-12 LAB — RAPID HIV SCREEN (HIV 1/2 AB+AG)
HIV 1/2 Antibodies: NONREACTIVE
HIV-1 P24 Antigen - HIV24: NONREACTIVE

## 2019-01-12 MED ORDER — SODIUM CHLORIDE 0.9% FLUSH
3.0000 mL | Freq: Once | INTRAVENOUS | Status: AC
  Administered 2019-01-12: 3 mL via INTRAVENOUS

## 2019-01-12 MED ORDER — BENZONATATE 100 MG PO CAPS: 100.0000 mg | ORAL_CAPSULE | Freq: Three times a day (TID) | ORAL | 0 refills | Status: DC | PRN

## 2019-01-12 MED ORDER — OSELTAMIVIR PHOSPHATE 75 MG PO CAPS: 75.0000 mg | ORAL_CAPSULE | Freq: Two times a day (BID) | ORAL | 0 refills | Status: DC

## 2019-01-12 MED ORDER — IBUPROFEN 800 MG PO TABS
800.0000 mg | ORAL_TABLET | Freq: Once | ORAL | Status: AC
  Administered 2019-01-12: 800 mg via ORAL
  Filled 2019-01-12: qty 1

## 2019-01-12 MED ORDER — ACETAMINOPHEN 325 MG PO TABS
650.0000 mg | ORAL_TABLET | Freq: Once | ORAL | Status: AC | PRN
  Administered 2019-01-12: 650 mg via ORAL
  Filled 2019-01-12: qty 2

## 2019-01-12 NOTE — ED Triage Notes (Signed)
Pt presents with c/o fever and chills. Pt reports his fever was 104 at home at its highest. Pt reports he took some ibuprofen and cold and flu medicine last night for his temperature and all day today, last dose this morning around 10 am.

## 2019-01-12 NOTE — Discharge Instructions (Signed)
You have been diagnosed with Influenza B.  Rest, drink plenty of fluid, take tamiflu and follow up with your doctor for further care.

## 2019-01-12 NOTE — ED Notes (Signed)
[place] notified of pt temp and verbal order for Motrin 800mg  PO given.

## 2019-01-12 NOTE — ED Provider Notes (Signed)
[facility]   CSN: [id number] Arrival date & time: 01/12/19  1654     History   Chief Complaint Chief Complaint  Patient presents with  . Fever  . Chills    HPI Craig Wheeler is a 33 y.o. male.  The history is provided by the patient. No language interpreter was used.  Fever     33 year old male presenting for evaluation of flulike symptoms.  Patient report he developed a cough productive with yellow-green sputum yesterday followed by fever, and body aches.  States 4 of his kids at been diagnosed with the flu.  Patient however denies any significant headache, vision changes, light or sound sensitivity, chest pain, shortness of breath, nausea vomiting diarrhea, sore throat, or congestion.     Past Medical History:  Diagnosis Date  . Skin abscess     There are no active problems to display for this patient.   History reviewed. No pertinent surgical history.      Home Medications    Prior to Admission medications   Medication Sig Start Date End Date Taking? Authorizing Provider  benzonatate (TESSALON) 100 MG capsule Take 1 capsule (100 mg total) by mouth 3 (three) times daily as needed for cough. 10/26/18   Tegeler, Canary Brim, MD    Family History History reviewed. No pertinent family history.  Social History Social History   Tobacco Use  . Smoking status: Current Every Day Smoker    Packs/day: 0.50    Types: Cigarettes  . Smokeless tobacco: Never Used  Substance Use Topics  . Alcohol use: Yes    Comment: occ  . Drug use: No     Allergies   Patient has no known allergies.   Review of Systems Review of Systems  Constitutional: Positive for fever.  All other systems reviewed and are negative.    Physical Exam Updated Vital Signs BP 123/77 (BP Location: Left Arm)   Pulse (!) 101   Temp (!) 103.1 F (39.5 C) (Oral)   Resp 18   Ht 5\' 8"  (1.727 m)   Wt 90.3 kg   SpO2 97%   BMI 30.26  kg/m   Physical Exam Vitals signs and nursing note reviewed.  Constitutional:      General: He is not in acute distress.    Appearance: He is well-developed.  HENT:     Head: Atraumatic.     Right Ear: Tympanic membrane normal.     Left Ear: Tympanic membrane normal.     Nose: Nose normal.     Mouth/Throat:     Mouth: Mucous membranes are moist.  Eyes:     Conjunctiva/sclera: Conjunctivae normal.  Neck:     Musculoskeletal: Normal range of motion and neck supple. No neck rigidity.  Cardiovascular:     Rate and Rhythm: Normal rate and regular rhythm.     Heart sounds: Normal heart sounds.  Pulmonary:     Effort: Pulmonary effort is normal.     Breath sounds: Normal breath sounds. No wheezing, rhonchi or rales.  Abdominal:     Palpations: Abdomen is soft.     Tenderness: There is no abdominal tenderness.  Skin:    Findings: No rash.  Neurological:     Mental Status: He is alert and oriented to person, place, and time.      ED Treatments / Results  Labs (all labs ordered are listed, but only abnormal results are displayed) Labs Reviewed  COMPREHENSIVE METABOLIC  PANEL - Abnormal; Notable for the following components:      Result Value   Calcium 8.7 (*)    All other components within normal limits  CBC WITH DIFFERENTIAL/PLATELET - Abnormal; Notable for the following components:   WBC 3.7 (*)    Lymphs Abs 0.6 (*)    All other components within normal limits  INFLUENZA PANEL BY PCR (TYPE A & B) - Abnormal; Notable for the following components:   Influenza B By PCR POSITIVE (*)    All other components within normal limits  LACTIC ACID, PLASMA  LACTIC ACID, PLASMA  URINALYSIS, ROUTINE W REFLEX MICROSCOPIC  RAPID HIV SCREEN (HIV 1/2 AB+AG)    EKG None  Radiology No results found.  Procedures Procedures (including critical care time)  Medications Ordered in ED Medications  acetaminophen (TYLENOL) tablet 650 mg (650 mg Oral Given 01/12/19 1707)  sodium  chloride flush (NS) 0.9 % injection 3 mL (3 mLs Intravenous Given 01/12/19 1843)  ibuprofen (ADVIL,MOTRIN) tablet 800 mg (800 mg Oral Given 01/12/19 2123)     Initial Impression / Assessment and Plan / ED Course  I have reviewed the triage vital signs and the nursing notes.  Pertinent labs & imaging results that were available during my care of the patient were reviewed by me and considered in my medical decision making (see chart for details).     BP 123/77 (BP Location: Left Arm)   Pulse (!) 101   Temp (!) 103.1 F (39.5 C) (Oral)   Resp 18   Ht 5\' 8"  (1.727 m)   Wt 90.3 kg   SpO2 97%   BMI 30.26 kg/m    Final Clinical Impressions(s) / ED Diagnoses   Final diagnoses:  Influenza B    ED Discharge Orders         Ordered    benzonatate (TESSALON) 100 MG capsule  3 times daily PRN     01/12/19 2012    oseltamivir (TAMIFLU) 75 MG capsule  Every 12 hours,   Status:  Discontinued     01/12/19 2012    oseltamivir (TAMIFLU) 75 MG capsule  Every 12 hours     01/12/19 2116         Patient with symptoms consistent with influenza.  Vitals are stable, low-grade fever.  No signs of dehydration, tolerating PO's.  Lungs are clear. Due to patient's presentation and physical exam a chest x-ray was not ordered bc likely diagnosis of flu.  Discussed the cost versus benefit of Tamiflu treatment with the patient.  The patient understands that symptoms are greater than the recommended 24-48 hour window of treatment.  Patient will be discharged with instructions to orally hydrate, rest, and use over-the-counter medications such as anti-inflammatories ibuprofen and Aleve for muscle aches and Tylenol for fever.  Patient will also be given a cough suppressant.     Fayrene Helperran, Alben Jepsen, PA-C 01/12/19 2129    Pricilla LovelessGoldston, Scott, MD 01/13/19 [phone]

## 2019-10-26 ENCOUNTER — Encounter (HOSPITAL_COMMUNITY): Payer: Self-pay

## 2019-10-26 ENCOUNTER — Other Ambulatory Visit: Payer: Self-pay

## 2019-10-26 ENCOUNTER — Ambulatory Visit (HOSPITAL_COMMUNITY): Payer: Self-pay

## 2019-10-26 ENCOUNTER — Ambulatory Visit (HOSPITAL_COMMUNITY)
Admission: EM | Admit: 2019-10-26 | Discharge: 2019-10-26 | Disposition: A | Discharge status: Home / Self Care | Payer: Worker's Compensation | Attending: Physician Assistant | Admitting: Physician Assistant

## 2019-10-26 ENCOUNTER — Ambulatory Visit (INDEPENDENT_AMBULATORY_CARE_PROVIDER_SITE_OTHER): Payer: Worker's Compensation

## 2019-10-26 ENCOUNTER — Ambulatory Visit (HOSPITAL_COMMUNITY): Payer: Worker's Compensation

## 2019-10-26 DIAGNOSIS — S62654A Nondisplaced fracture of medial phalanx of right ring finger, initial encounter for closed fracture: Secondary | ICD-10-CM

## 2019-10-26 NOTE — ED Triage Notes (Signed)
Pt states he was ruining away from a dog who tried to attack him 2 hrs ago aprox and he jumped to the bed of a pick up truck and he hurt his right ring finger. Pt states he can not bend his right ring finger. Pt states having a bruise in his left shin. Pt took ibuprofen 800 mg 2 hrs ago.

## 2019-10-26 NOTE — ED Provider Notes (Signed)
[facility]    CSN: [id number] Arrival date & time: 10/26/19  1724      History   Chief Complaint Chief Complaint  Patient presents with  . Finger Injury  . bruising    HPI Craig Wheeler is a 33 y.o. male.   The history is provided by the patient. No language interpreter was used.  Hand Pain This is a new problem. The current episode started 3 to 5 hours ago. The problem occurs constantly. The problem has not changed since onset.Nothing aggravates the symptoms. Nothing relieves the symptoms.  Pt works for [employer]  Pt was chased by a dog.  Pt complains of a bruised area to his left leg, Pt has swollen tender area to his left ring finger  Past Medical History:  Diagnosis Date  . Skin abscess     There are no active problems to display for this patient.   History reviewed. No pertinent surgical history.     Home Medications    Prior to Admission medications   Medication Sig Start Date End Date Taking? Authorizing Provider  benzonatate (TESSALON) 100 MG capsule Take 1 capsule (100 mg total) by mouth 3 (three) times daily as needed for cough. 01/12/19   Fayrene Helper, PA-C  Homeopathic Products (COLD/FLU MEDICINE PO) Take 2 capsules by mouth every 6 (six) hours as needed (cold/flu symptoms).    [provider]  ibuprofen (ADVIL,MOTRIN) 200 MG tablet Take 400 mg by mouth every 6 (six) hours as needed for headache, mild pain or moderate pain.    [provider]  oseltamivir (TAMIFLU) 75 MG capsule Take 1 capsule (75 mg total) by mouth every 12 (twelve) hours. 01/12/19   Fayrene Helper, PA-C    Family History History reviewed. No pertinent family history.  Social History Social History   Tobacco Use  . Smoking status: Current Every Day Smoker    Packs/day: 0.50    Types: Cigarettes  . Smokeless tobacco: Never Used  Substance Use Topics  . Alcohol use: Yes    Comment: occ  . Drug use: No     Allergies   Patient has no known  allergies.   Review of Systems Review of Systems  All other systems reviewed and are negative.    Physical Exam Triage Vital Signs ED Triage Vitals  Enc Vitals Group     BP 10/26/19 1743 134/83     Pulse Rate 10/26/19 1743 64     Resp 10/26/19 1743 16     Temp 10/26/19 1743 99.6 F (37.6 C)     Temp Source 10/26/19 1743 Oral     SpO2 10/26/19 1743 100 %     Weight --      Height --      Head Circumference --      Peak Flow --      Pain Score 10/26/19 1740 5     Pain Loc --      Pain Edu? --      Excl. in GC? --    No data found.  Updated Vital Signs BP 134/83 (BP Location: Left Arm)   Pulse 64   Temp 99.6 F (37.6 C) (Oral)   Resp 16   SpO2 100%   Visual Acuity Right Eye Distance:   Left Eye Distance:   Bilateral Distance:    Right Eye Near:   Left Eye Near:    Bilateral Near:     Physical Exam Vitals signs reviewed.  Musculoskeletal:  General: Swelling and tenderness present.     Comments: Swollen tender right 4th finger 2cm bruise left mid shin left leg  Skin:    General: Skin is warm.  Neurological:     General: No focal deficit present.     Mental Status: He is alert.  Psychiatric:        Mood and Affect: Mood normal.      UC Treatments / Results  Labs (all labs ordered are listed, but only abnormal results are displayed) Labs Reviewed - No data to display  EKG   Radiology No results found.  Procedures Procedures (including critical care time)  Medications Ordered in UC Medications - No data to display  Initial Impression / Assessment and Plan / UC Course  I have reviewed the triage vital signs and the nursing notes.  Pertinent labs & imaging results that were available during my care of the patient were reviewed by me and considered in my medical decision making (see chart for details).     MDM  Xray reviewed and discussed with pt.  Pt advised to follow up with Dr. Mardelle Matte for recheck  Final Clinical Impressions(s) /  UC Diagnoses   Final diagnoses:  Closed nondisplaced fracture of middle phalanx of right ring finger, initial encounter   Discharge Instructions   None    ED Prescriptions    None     An After Visit Summary was printed and given to the patient.   PDMP not reviewed this encounter.   Fransico Meadow, [place] 10/26/19 1827

## 2021-09-18 IMAGING — DX DG FINGER RING 2+V*R*
3 series · 3 of 3 positions shown · non-contrast
Comparison: None.

CLINICAL DATA: Per pt: running from a dog, jumped into a truck bed
to get away from the dog, bent the right ring finger. Pain is the
right ring finger, proximal and MCP. LROM with the ring finger,
finger feels numb per patient. Prior dislocatio.*comment was
truncated*

EXAM:
RIGHT RING FINGER 2+V

[finger ap]
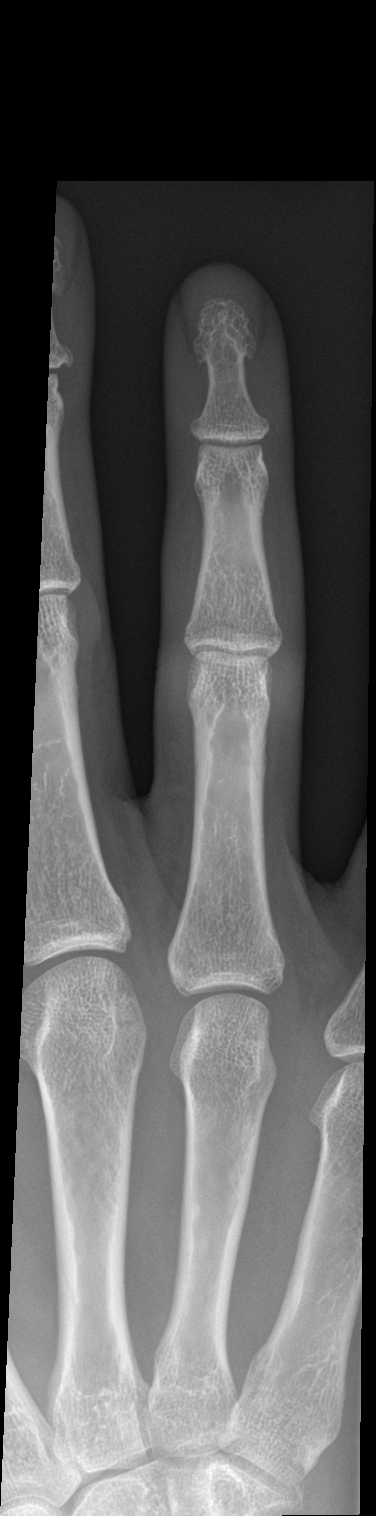

[finger obl]
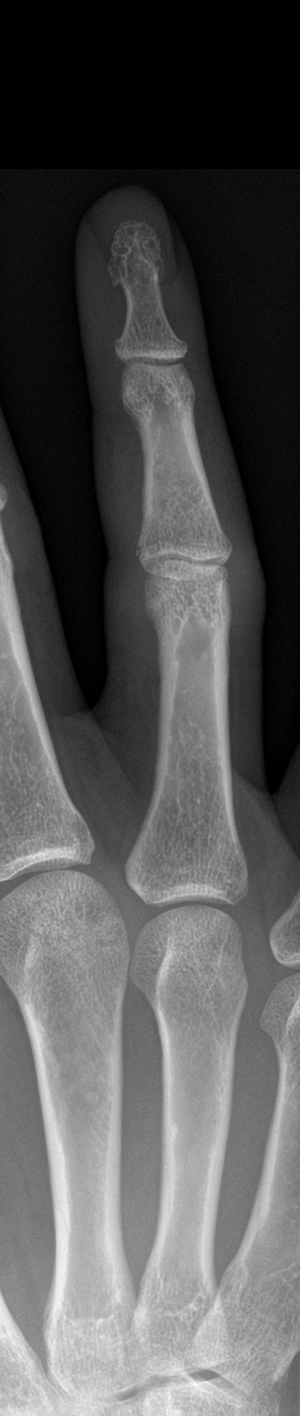

[finger lat]
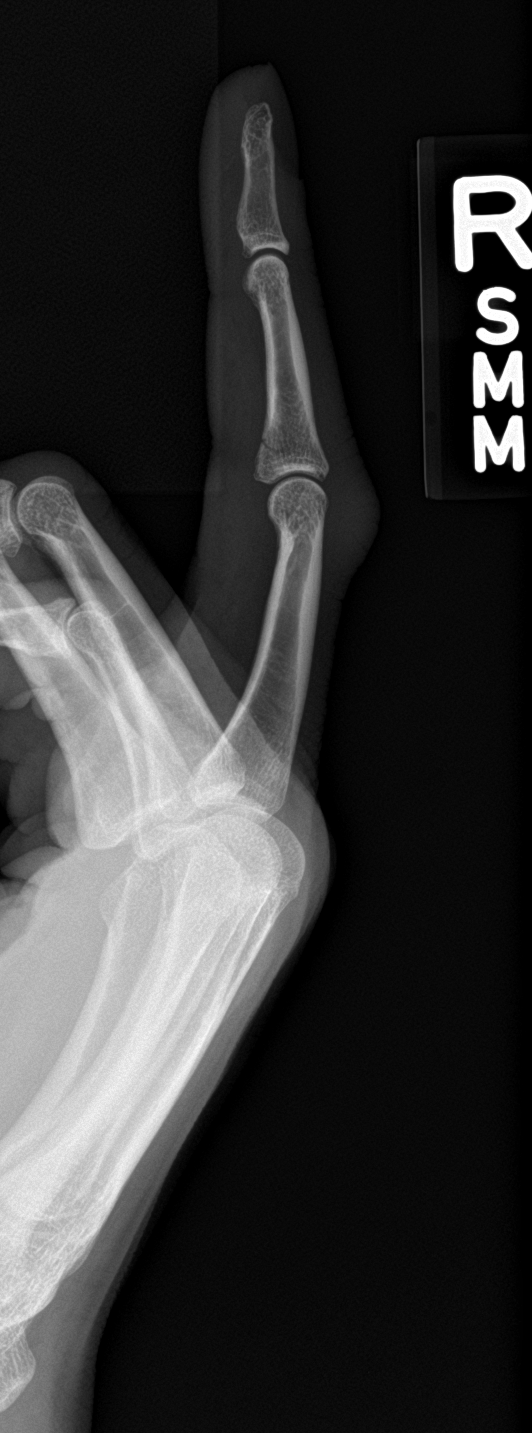

[3 of 3 positions shown; findings below may reference images not displayed]

FINDINGS: There is a tiny cortical lucency seen on the lateral view at the
base of the middle phalanx which could represent a nondisplaced
fracture. No other acute finding in the fourth digit. No evidence of
dislocation. There is soft tissue swelling about the PIP joint.
IMPRESSION: Possible nondisplaced fracture at the base of the middle phalanx of
the fourth digit. No evidence of dislocation.

## 2022-05-04 ENCOUNTER — Emergency Department (HOSPITAL_COMMUNITY): Payer: PRIVATE HEALTH INSURANCE

## 2022-05-04 ENCOUNTER — Emergency Department (HOSPITAL_COMMUNITY)
Admission: EM | Admit: 2022-05-04 | Discharge: 2022-05-04 | Disposition: A | Discharge status: Home / Self Care | Payer: PRIVATE HEALTH INSURANCE | Attending: Emergency Medicine | Admitting: Emergency Medicine

## 2022-05-04 ENCOUNTER — Encounter (HOSPITAL_COMMUNITY): Payer: Self-pay | Admitting: Oncology

## 2022-05-04 ENCOUNTER — Other Ambulatory Visit: Payer: Self-pay

## 2022-05-04 DIAGNOSIS — R072 Precordial pain: Secondary | ICD-10-CM | POA: Diagnosis present

## 2022-05-04 DIAGNOSIS — I10 Essential (primary) hypertension: Secondary | ICD-10-CM | POA: Diagnosis not present

## 2022-05-04 DIAGNOSIS — Z79899 Other long term (current) drug therapy: Secondary | ICD-10-CM | POA: Insufficient documentation

## 2022-05-04 LAB — BASIC METABOLIC PANEL
Anion gap: 7 (ref 5–15)
BUN: 10 mg/dL (ref 6–20)
CO2: 25 mmol/L (ref 22–32)
Calcium: 8.7 mg/dL — ABNORMAL LOW (ref 8.9–10.3)
Chloride: 108 mmol/L (ref 98–111)
Creatinine, Ser: 1.01 mg/dL (ref 0.61–1.24)
GFR, Estimated: >60 mL/min (ref >60)
Glucose, Bld: 122 mg/dL — ABNORMAL HIGH (ref 70–99)
Potassium: 3.3 mmol/L — ABNORMAL LOW (ref 3.5–5.1)
Sodium: 140 mmol/L (ref 135–145)

## 2022-05-04 LAB — CBC
HCT: 42.6 % (ref 39.0–52.0)
Hemoglobin: 14.4 g/dL (ref 13.0–17.0)
MCH: 27.4 pg (ref 26.0–34.0)
MCHC: 33.8 g/dL (ref 30.0–36.0)
MCV: 81.1 fL (ref 80.0–100.0)
Platelets: 284 10*3/uL (ref 150–400)
RBC: 5.25 MIL/uL (ref 4.22–5.81)
RDW: 14.4 % (ref 11.5–15.5)
WBC: 5.4 10*3/uL (ref 4.0–10.5)
nRBC: 0 % (ref 0.0–0.2)

## 2022-05-04 MED ORDER — AMLODIPINE BESYLATE 5 MG PO TABS: 5.0000 mg | ORAL_TABLET | Freq: Every day | ORAL | 0 refills | Status: DC

## 2022-05-04 NOTE — ED Provider Notes (Signed)
[facility]   CSN: [id number] Arrival date & time: 05/04/22  1536     History Chief Complaint  Patient presents with   Hypertension    Craig Wheeler is a 36 y.o. male who presents to the emergency department today with elevated blood pressure, increased level of stress, and substernal chest tightness.  Patient was initially complaining of substernal chest tightness in triage that is since resolved.  Patient has never been diagnosed formally with high blood pressure.  He denies chest pain currently, shortness of breath, headache, hematuria, abdominal pain, nausea,, diarrhea.   Hypertension      Home Medications Prior to Admission medications   Medication Sig Start Date End Date Taking? Authorizing Provider  amLODipine (NORVASC) 5 MG tablet Take 1 tablet (5 mg total) by mouth daily. 05/04/22  Yes Meredeth Ide, Carry Weesner M, PA-C  benzonatate (TESSALON) 100 MG capsule Take 1 capsule (100 mg total) by mouth 3 (three) times daily as needed for cough. 01/12/19   Fayrene Helper, PA-C  Homeopathic Products (COLD/FLU MEDICINE PO) Take 2 capsules by mouth every 6 (six) hours as needed (cold/flu symptoms).    [provider]  ibuprofen (ADVIL,MOTRIN) 200 MG tablet Take 400 mg by mouth every 6 (six) hours as needed for headache, mild pain or moderate pain.    [provider]  oseltamivir (TAMIFLU) 75 MG capsule Take 1 capsule (75 mg total) by mouth every 12 (twelve) hours. 01/12/19   Fayrene Helper, PA-C      Allergies    Patient has no known allergies.    Review of Systems   Review of Systems  All other systems reviewed and are negative.  Physical Exam Updated Vital Signs BP (!) 150/106   Pulse 67   Temp 98.4 F (36.9 C) (Oral)   Resp 18   Ht 5\' 8"  (1.727 m)   Wt 95.3 kg   SpO2 98%   BMI 31.93 kg/m  Physical Exam Vitals and nursing note reviewed.  Constitutional:      General: He is not in acute distress.    Appearance:  Normal appearance.  HENT:     Head: Normocephalic and atraumatic.  Eyes:     General:        Right eye: No discharge.        Left eye: No discharge.  Cardiovascular:     Comments: Regular rate and rhythm.  S1/S2 are distinct without any evidence of murmur, rubs, or gallops.  Radial pulses are 2+ bilaterally.  Dorsalis pedis pulses are 2+ bilaterally.  No evidence of pedal edema. Pulmonary:     Comments: Clear to auscultation bilaterally.  Normal effort.  No respiratory distress.  No evidence of wheezes, rales, or rhonchi heard throughout. Abdominal:     General: Abdomen is flat. Bowel sounds are normal. There is no distension.     Tenderness: There is no abdominal tenderness. There is no guarding or rebound.  Musculoskeletal:        General: Normal range of motion.     Cervical back: Neck supple.  Skin:    General: Skin is warm and dry.     Findings: No rash.  Neurological:     General: No focal deficit present.     Mental Status: He is alert.  Psychiatric:        Mood and Affect: Mood normal.        Behavior: Behavior normal.    ED Results / Procedures / Treatments  Labs (all labs ordered are listed, but only abnormal results are displayed) Labs Reviewed  BASIC METABOLIC PANEL - Abnormal; Notable for the following components:      Result Value   Potassium 3.3 (*)    Glucose, Bld 122 (*)    Calcium 8.7 (*)    All other components within normal limits  CBC    EKG EKG Interpretation  Date/Time:  Thursday May 04 2022 15:43:26 EDT Ventricular Rate:  91 PR Interval:  145 QRS Duration: 89 QT Interval:  363 QTC Calculation: 447 R Axis:   74 Text Interpretation: Sinus rhythm Borderline repolarization abnormality since last tracing no significant change Confirmed by Mancel Bale [phone] on 05/04/2022 3:51:52 PM  Radiology DG Chest 2 View  Result Date: 05/04/2022 CLINICAL DATA:  [facility] EXAM: CHEST - 2 VIEW COMPARISON:  Chest x-ray October 26, 2018. FINDINGS: No  consolidation. No visible pleural effusions or pneumothorax. Cardiomediastinal silhouette is within normal limits. No significant change from the prior. IMPRESSION: No evidence acute cardiopulmonary disease. Electronically Signed   By: Feliberto Harts M.D.   On: 05/04/2022 16:30    Procedures Procedures    Medications Ordered in ED Medications - No data to display  ED Course/ Medical Decision Making/ A&P                           Medical Decision Making Craig Wheeler is a 36 y.o. male who presents to the emergency department today for further evaluation of elevated blood pressure.  Differential diagnosis includes hypertensive urgency versus emergency.  This is likely hypertensive urgency.  Labs and imaging were ordered up in triage.  Interpreted by myself.  Overall, patient is in no acute distress.  Repeat pressure was 150/90.  Patient does not have primary care.  I will start him on low-dose amlodipine.  We also discussed lifestyle modifications.  Shared decision-making was done whether not to do a troponin at this time.  Patient ultimately denied.  I think this is a fair decision considering that he does not currently have any chest pain.  I discussed red flag symptoms to return to the emerged department to get repeat testing and troponin levels at that time.  EKG was without signs of active ischemia.  Chest x-ray showed no acute abnormalities.  Overall, patient is in no acute distress at this time and safer discharge.   Amount and/or Complexity of Data Reviewed Radiology: independent interpretation performed. ECG/medicine tests: independent interpretation performed.   Final Clinical Impression(s) / ED Diagnoses Final diagnoses:  Hypertension, unspecified type    Rx / DC Orders ED Discharge Orders          Ordered    amLODipine (NORVASC) 5 MG tablet  Daily        05/04/22 1921              Jolyn Lent 05/04/22 Jule Ser, MD 05/05/22  0222

## 2022-05-04 NOTE — ED Provider Triage Note (Signed)
Emergency Medicine Provider Triage Evaluation Note  KELLER BOUNDS , a 36 y.o. male  was evaluated in triage.  Pt complains of increasing stress and anxiety with high blood pressure over last 2 weeks.  Complaining of intermittent numbness of 1 arm at a time, lasting anywhere from 1 to 2 minutes.  Also noting intermittent shortness of breath and tightness in his chest.  Hx of daily tobacco use.  Denies recent upper respiratory infection, fever, chest pain, numbness/tingling of legs, N/V/D.  Review of Systems  Positive:  Negative: As above  Physical Exam  BP (!) 193/110   Pulse 90   Temp 98.4 F (36.9 C) (Oral)   Resp 18   Ht 5\' 8"  (1.727 m)   Wt 95.3 kg   SpO2 98%   BMI 31.93 kg/m  Gen:   Awake, no distress, anxious appearing Resp:  Normal effort, CTAB MSK:   Moves extremities without difficulty  Other:  Upper and lower extremities appear neurovascularly intact.  RRR without M/R/G.  Chest non-TTP.  Medical Decision Making  Medically screening exam initiated at 4:01 PM.  Appropriate orders placed.  was informed that the remainder of the evaluation will be completed by another provider, this initial triage assessment does not replace that evaluation, and the importance of remaining in the ED until their evaluation is complete.  Labs, imaging, EKG.   Rona Ravens, PA-C 05/04/22 1609

## 2022-05-04 NOTE — Discharge Instructions (Signed)
Please follow-up with [facility] for further evaluation.  Also given you a low-dose of amlodipine.  Please take this daily.  I would like for you to do some lifestyle modifications like we discussed today.  Please return to the emergency room for any worsening symptoms like we discussed as well.

## 2022-05-04 NOTE — ED Triage Notes (Signed)
Pt. States high blood pressure that has been rising over the past two weeks. Pt. States increased stress from child custody battle.

## 2023-10-22 ENCOUNTER — Telehealth: Payer: No Typology Code available for payment source | Admitting: Physician Assistant

## 2023-10-22 DIAGNOSIS — J208 Acute bronchitis due to other specified organisms: Secondary | ICD-10-CM

## 2023-10-22 DIAGNOSIS — B9689 Other specified bacterial agents as the cause of diseases classified elsewhere: Secondary | ICD-10-CM | POA: Diagnosis not present

## 2023-10-22 MED ORDER — AZITHROMYCIN 250 MG PO TABS: ORAL_TABLET | ORAL | 0 refills | Status: AC

## 2023-10-22 MED ORDER — PSEUDOEPH-BROMPHEN-DM 30-2-10 MG/5ML PO SYRP: 5.0000 mL | ORAL_SOLUTION | Freq: Four times a day (QID) | ORAL | 0 refills | Status: DC | PRN

## 2023-10-22 NOTE — Progress Notes (Signed)
Virtual Visit Consent   Craig Wheeler, you are scheduled for a virtual visit with a [facility] provider today. Just as with appointments in the office, your consent must be obtained to participate. Your consent will be active for this visit and any virtual visit you may have with one of our providers in the next 365 days. If you have a MyChart account, a copy of this consent can be sent to you electronically.  As this is a virtual visit, video technology does not allow for your provider to perform a traditional examination. This may limit your provider's ability to fully assess your condition. If your provider identifies any concerns that need to be evaluated in person or the need to arrange testing (such as labs, EKG, etc.), we will make arrangements to do so. Although advances in technology are sophisticated, we cannot ensure that it will always work on either your end or our end. If the connection with a video visit is poor, the visit may have to be switched to a telephone visit. With either a video or telephone visit, we are not always able to ensure that we have a secure connection.  By engaging in this virtual visit, you consent to the provision of healthcare and authorize for your insurance to be billed (if applicable) for the services provided during this visit. Depending on your insurance coverage, you may receive a charge related to this service.  I need to obtain your verbal consent now. Are you willing to proceed with your visit today? Craig Wheeler has provided verbal consent on 10/22/2023 for a virtual visit (video or telephone). Craig Loveless, PA-C  Date: 10/22/2023 5:49 PM  Virtual Visit via Video Note   I, Craig Wheeler, connected with  Craig Wheeler  ([id number], [birth year]) on 10/22/23 at  5:45 PM EST by a video-enabled telemedicine application and verified that I am speaking with the correct person using two identifiers.  Location: Patient: Virtual Visit  Location Patient: Home Provider: Virtual Visit Location Provider: Home Office   I discussed the limitations of evaluation and management by telemedicine and the availability of in person appointments. The patient expressed understanding and agreed to proceed.    History of Present Illness: Craig Wheeler is a 37 y.o. who identifies as a male who was assigned male at birth, and is being seen today for sinus congestion and cough.  HPI: Sinusitis This is a new problem. The current episode started 1 to 4 weeks ago. The problem has been gradually worsening since onset. There has been no fever. The pain is mild. Associated symptoms include congestion, coughing, headaches, sinus pressure (initial and now improved) and a sore throat (initially, now improved). Pertinent negatives include no chills, diaphoresis, ear pain, hoarse voice or shortness of breath. (fatigue) Treatments tried: benadryl, ibuprofen, OJ, cough drops. The treatment provided no relief.     Problems: There are no problems to display for this patient.   Allergies: No Known Allergies Medications:  Current Outpatient Medications:    azithromycin (ZITHROMAX) 250 MG tablet, Take 2 tablets on day 1, then 1 tablet daily on days 2 through 5, Disp: 6 tablet, Rfl: 0   brompheniramine-pseudoephedrine-DM 30-2-10 MG/5ML syrup, Take 5 mLs by mouth 4 (four) times daily as needed., Disp: 120 mL, Rfl: 0   amLODipine (NORVASC) 5 MG tablet, Take 1 tablet (5 mg total) by mouth daily., Disp: 30 tablet, Rfl: 0   benzonatate (TESSALON) 100 MG capsule, Take 1 capsule (100 mg  total) by mouth 3 (three) times daily as needed for cough., Disp: 21 capsule, Rfl: 0   Homeopathic Products (COLD/FLU MEDICINE PO), Take 2 capsules by mouth every 6 (six) hours as needed (cold/flu symptoms)., Disp: , Rfl:    ibuprofen (ADVIL,MOTRIN) 200 MG tablet, Take 400 mg by mouth every 6 (six) hours as needed for headache, mild pain or moderate pain., Disp: , Rfl:    oseltamivir  (TAMIFLU) 75 MG capsule, Take 1 capsule (75 mg total) by mouth every 12 (twelve) hours., Disp: 10 capsule, Rfl: 0  Observations/Objective: Patient is well-developed, well-nourished in no acute distress.  Resting comfortably at home.  Head is normocephalic, atraumatic.  No labored breathing.  Speech is clear and coherent with logical content.  Patient is alert and oriented at baseline.    Assessment and Plan: 1. Acute bacterial bronchitis - azithromycin (ZITHROMAX) 250 MG tablet; Take 2 tablets on day 1, then 1 tablet daily on days 2 through 5  Dispense: 6 tablet; Refill: 0 - brompheniramine-pseudoephedrine-DM 30-2-10 MG/5ML syrup; Take 5 mLs by mouth 4 (four) times daily as needed.  Dispense: 120 mL; Refill: 0  - Worsening over a week despite OTC medications - Will treat with Z-pack and Bromfed DM - Can continue Mucinex  - Push fluids.  - Rest.  - Steam and humidifier can help - Seek in person evaluation if worsening or symptoms fail to improve    Follow Up Instructions: I discussed the assessment and treatment plan with the patient. The patient was provided an opportunity to ask questions and all were answered. The patient agreed with the plan and demonstrated an understanding of the instructions.  A copy of instructions were sent to the patient via MyChart unless otherwise noted below.    The patient was advised to call back or seek an in-person evaluation if the symptoms worsen or if the condition fails to improve as anticipated.    Craig Loveless, PA-C

## 2023-10-22 NOTE — Patient Instructions (Signed)
Rona Ravens, thank you for joining Margaretann Loveless, PA-C for today's virtual visit.  While this provider is not your primary care provider (PCP), if your PCP is located in our provider database this encounter information will be shared with them immediately following your visit.   A [facility] MyChart account gives you access to today's visit and all your visits, tests, and labs performed at [facility] " click here if you don't have a [facility] MyChart account or go to [email or web address]  Consent: (Patient) Craig Wheeler provided verbal consent for this virtual visit at the beginning of the encounter.  Current Medications:  Current Outpatient Medications:    azithromycin (ZITHROMAX) 250 MG tablet, Take 2 tablets on day 1, then 1 tablet daily on days 2 through 5, Disp: 6 tablet, Rfl: 0   brompheniramine-pseudoephedrine-DM 30-2-10 MG/5ML syrup, Take 5 mLs by mouth 4 (four) times daily as needed., Disp: 120 mL, Rfl: 0   amLODipine (NORVASC) 5 MG tablet, Take 1 tablet (5 mg total) by mouth daily., Disp: 30 tablet, Rfl: 0   benzonatate (TESSALON) 100 MG capsule, Take 1 capsule (100 mg total) by mouth 3 (three) times daily as needed for cough., Disp: 21 capsule, Rfl: 0   Homeopathic Products (COLD/FLU MEDICINE PO), Take 2 capsules by mouth every 6 (six) hours as needed (cold/flu symptoms)., Disp: , Rfl:    ibuprofen (ADVIL,MOTRIN) 200 MG tablet, Take 400 mg by mouth every 6 (six) hours as needed for headache, mild pain or moderate pain., Disp: , Rfl:    oseltamivir (TAMIFLU) 75 MG capsule, Take 1 capsule (75 mg total) by mouth every 12 (twelve) hours., Disp: 10 capsule, Rfl: 0   Medications ordered in this encounter:  Meds ordered this encounter  Medications   azithromycin (ZITHROMAX) 250 MG tablet    Sig: Take 2 tablets on day 1, then 1 tablet daily on days 2 through 5    Dispense:  6 tablet    Refill:  0    Order Specific Question:   Supervising Provider     Answer:   Merrilee Jansky [[id number]]   brompheniramine-pseudoephedrine-DM 30-2-10 MG/5ML syrup    Sig: Take 5 mLs by mouth 4 (four) times daily as needed.    Dispense:  120 mL    Refill:  0    Order Specific Question:   Supervising Provider    Answer:   Merrilee Jansky [[id number]]     *If you need refills on other medications prior to your next appointment, please contact your pharmacy*  Follow-Up: Call back or seek an in-person evaluation if the symptoms worsen or if the condition fails to improve as anticipated.  [facility] [phone]  Other Instructions Acute Bronchitis, Adult  Acute bronchitis is sudden inflammation of the main airways (bronchi) that come off the windpipe (trachea) in the lungs. The swelling causes the airways to get smaller and make more mucus than normal. This can make it hard to breathe and can cause coughing or noisy breathing (wheezing). Acute bronchitis may last several weeks. The cough may last longer. Allergies, asthma, and exposure to smoke may make the condition worse. What are the causes? This condition can be caused by germs and by substances that irritate the lungs, including: Cold and flu viruses. The most common cause of this condition is the virus that causes the common cold. Bacteria. This is less common. Breathing in substances that irritate the lungs, including: Smoke from cigarettes and other forms  of tobacco. Dust and pollen. Fumes from household cleaning products, gases, or burned fuel. Indoor or outdoor air pollution. What increases the risk? The following factors may make you more likely to develop this condition: A weak body's defense system, also called the immune system. A condition that affects your lungs and breathing, such as asthma. What are the signs or symptoms? Common symptoms of this condition include: Coughing. This may bring up clear, yellow, or green mucus from your lungs (sputum). Wheezing. Runny or  stuffy nose. Having too much mucus in your lungs (chest congestion). Shortness of breath. Aches and pains, including sore throat or chest. How is this diagnosed? This condition is usually diagnosed based on: Your symptoms and medical history. A physical exam. You may also have other tests, including tests to rule out other conditions, such as pneumonia. These tests include: A test of lung function. Test of a mucus sample to look for the presence of bacteria. Tests to check the oxygen level in your blood. Blood tests. Chest X-ray. How is this treated? Most cases of acute bronchitis clear up over time without treatment. Your health care provider may recommend: Drinking more fluids to help thin your mucus so it is easier to cough up. Taking inhaled medicine (inhaler) to improve air flow in and out of your lungs. Using a vaporizer or a humidifier. These are machines that add water to the air to help you breathe better. Taking a medicine that thins mucus and clears congestion (expectorant). Taking a medicine that prevents or stops coughing (cough suppressant). It is not common to take an antibiotic medicine for this condition. Follow these instructions at home:  Take over-the-counter and prescription medicines only as told by your health care provider. Use an inhaler, vaporizer, or humidifier as told by your health care provider. Take two teaspoons (10 mL) of honey at bedtime to lessen coughing at night. Drink enough fluid to keep your urine pale yellow. Do not use any products that contain nicotine or tobacco. These products include cigarettes, chewing tobacco, and vaping devices, such as e-cigarettes. If you need help quitting, ask your health care provider. Get plenty of rest. Return to your normal activities as told by your health care provider. Ask your health care provider what activities are safe for you. Keep all follow-up visits. This is important. How is this prevented? To lower  your risk of getting this condition again: Wash your hands often with soap and water for at least 20 seconds. If soap and water are not available, use hand sanitizer. Avoid contact with people who have cold symptoms. Try not to touch your mouth, nose, or eyes with your hands. Avoid breathing in smoke or chemical fumes. Breathing smoke or chemical fumes will make your condition worse. Get the flu shot every year. Contact a health care provider if: Your symptoms do not improve after 2 weeks. You have trouble coughing up the mucus. Your cough keeps you awake at night. You have a fever. Get help right away if you: Cough up blood. Feel pain in your chest. Have severe shortness of breath. Faint or keep feeling like you are going to faint. Have a severe headache. Have a fever or chills that get worse. These symptoms may represent a serious problem that is an emergency. Do not wait to see if the symptoms will go away. Get medical help right away. Call your local emergency services (911 in the [place] Do not drive yourself to the hospital. Summary Acute bronchitis is inflammation  of the main airways (bronchi) that come off the windpipe (trachea) in the lungs. The swelling causes the airways to get smaller and make more mucus than normal. Drinking more fluids can help thin your mucus so it is easier to cough up. Take over-the-counter and prescription medicines only as told by your health care provider. Do not use any products that contain nicotine or tobacco. These products include cigarettes, chewing tobacco, and vaping devices, such as e-cigarettes. If you need help quitting, ask your health care provider. Contact a health care provider if your symptoms do not improve after 2 weeks. This information is not intended to replace advice given to you by your health care provider. Make sure you discuss any questions you have with your health care provider. Document Revised: 03/16/2022 Document Reviewed:  04/06/2021 Elsevier Patient Education  2024 Elsevier [employer].    If you have been instructed to have an in-person evaluation today at a local Urgent Care facility, please use the link below. It will take you to a list of all of our available [facility], including address, phone number and hours of operation. Please do not delay care.  [facility]  If you or a family member do not have a primary care provider, use the link below to schedule a visit and establish care. When you choose a [facility] primary care physician or advanced practice provider, you gain a long-term partner in health. Find a Primary Care Provider  Learn more about [facility] in-office and virtual care options:  - Get Care Now

## 2023-12-12 ENCOUNTER — Encounter (HOSPITAL_COMMUNITY): Payer: Self-pay

## 2023-12-12 ENCOUNTER — Emergency Department (HOSPITAL_COMMUNITY)
Admission: EM | Admit: 2023-12-12 | Discharge: 2023-12-12 | Disposition: A | Discharge status: Home / Self Care | Payer: No Typology Code available for payment source | Attending: Emergency Medicine | Admitting: Emergency Medicine

## 2023-12-12 ENCOUNTER — Emergency Department (HOSPITAL_COMMUNITY): Payer: No Typology Code available for payment source

## 2023-12-12 ENCOUNTER — Other Ambulatory Visit: Payer: Self-pay

## 2023-12-12 DIAGNOSIS — R0789 Other chest pain: Secondary | ICD-10-CM | POA: Insufficient documentation

## 2023-12-12 DIAGNOSIS — R519 Headache, unspecified: Secondary | ICD-10-CM | POA: Diagnosis present

## 2023-12-12 DIAGNOSIS — R42 Dizziness and giddiness: Secondary | ICD-10-CM | POA: Diagnosis not present

## 2023-12-12 LAB — CBC
HCT: 45.5 % (ref 39.0–52.0)
Hemoglobin: 15.5 g/dL (ref 13.0–17.0)
MCH: 27.2 pg (ref 26.0–34.0)
MCHC: 34.1 g/dL (ref 30.0–36.0)
MCV: 80 fL (ref 80.0–100.0)
Platelets: 298 10*3/uL (ref 150–400)
RBC: 5.69 MIL/uL (ref 4.22–5.81)
RDW: 15 % (ref 11.5–15.5)
WBC: 5.2 10*3/uL (ref 4.0–10.5)
nRBC: 0 % (ref 0.0–0.2)

## 2023-12-12 LAB — BASIC METABOLIC PANEL
Anion gap: 11 (ref 5–15)
BUN: 7 mg/dL (ref 6–20)
CO2: 26 mmol/L (ref 22–32)
Calcium: 9.3 mg/dL (ref 8.9–10.3)
Chloride: 103 mmol/L (ref 98–111)
Creatinine, Ser: 1.31 mg/dL — ABNORMAL HIGH (ref 0.61–1.24)
GFR, Estimated: >60 mL/min (ref >60)
Glucose, Bld: 147 mg/dL — ABNORMAL HIGH (ref 70–99)
Potassium: 3.7 mmol/L (ref 3.5–5.1)
Sodium: 140 mmol/L (ref 135–145)

## 2023-12-12 LAB — TROPONIN I (HIGH SENSITIVITY)
Troponin I (High Sensitivity): 3 ng/L (ref <18)
Troponin I (High Sensitivity): 3 ng/L (ref <18)

## 2023-12-12 MED ORDER — SODIUM CHLORIDE 0.9 % IV BOLUS
1000.0000 mL | Freq: Once | INTRAVENOUS | Status: AC
  Administered 2023-12-12: 1000 mL via INTRAVENOUS

## 2023-12-12 MED ORDER — PROCHLORPERAZINE EDISYLATE 10 MG/2ML IJ SOLN
10.0000 mg | Freq: Once | INTRAMUSCULAR | Status: AC
  Administered 2023-12-12: 10 mg via INTRAVENOUS
  Filled 2023-12-12: qty 2

## 2023-12-12 MED ORDER — DIPHENHYDRAMINE HCL 50 MG/ML IJ SOLN
50.0000 mg | Freq: Once | INTRAMUSCULAR | Status: AC
  Administered 2023-12-12: 50 mg via INTRAVENOUS
  Filled 2023-12-12: qty 1

## 2023-12-12 MED ORDER — PROCHLORPERAZINE MALEATE 10 MG PO TABS: 10.0000 mg | ORAL_TABLET | Freq: Three times a day (TID) | ORAL | 0 refills | Status: DC | PRN

## 2023-12-12 NOTE — ED Triage Notes (Signed)
Pt came to ED for CP and migraine started last night. Non radiating CP. No Hx of migraine. Axox4. C/O dizziness and blurred vision.

## 2023-12-12 NOTE — Discharge Instructions (Signed)
Your history, exam, workup today was overall reassuring.  Your labs did not show significant findings that would require admission at this time.  You are troponin or heart enzyme was negative both times we checked it.  As your symptoms improved after the medications we feel this is more related to the headache you are experiencing.  Rest and stay hydrated and follow-up with a primary doctor.  Please use the Compazine to help with symptoms and you may take Benadryl if you have any agitation with that.  If any symptoms change or worsen acutely, please return to the nearest emergency department.

## 2023-12-12 NOTE — ED Provider Notes (Signed)
EMERGENCY DEPARTMENT AT [facility] Provider Note   CSN: [id number] Arrival date & time: 12/12/23  1432     History {Add pertinent medical, surgical, social history, OB history to HPI:1} Chief Complaint  Patient presents with   Chest Pain   Migraine    Craig Wheeler is a 37 y.o. male.   Chest Pain Migraine Associated symptoms include chest pain.       Home Medications Prior to Admission medications   Medication Sig Start Date End Date Taking? Authorizing Provider  amLODipine (NORVASC) 5 MG tablet Take 1 tablet (5 mg total) by mouth daily. 05/04/22   Honor Loh M, PA-C  benzonatate (TESSALON) 100 MG capsule Take 1 capsule (100 mg total) by mouth 3 (three) times daily as needed for cough. 01/12/19   Fayrene Helper, PA-C  brompheniramine-pseudoephedrine-DM 30-2-10 MG/5ML syrup Take 5 mLs by mouth 4 (four) times daily as needed. 10/22/23   Margaretann Loveless, PA-C  Homeopathic Products (COLD/FLU MEDICINE PO) Take 2 capsules by mouth every 6 (six) hours as needed (cold/flu symptoms).    [provider]  ibuprofen (ADVIL,MOTRIN) 200 MG tablet Take 400 mg by mouth every 6 (six) hours as needed for headache, mild pain or moderate pain.    [provider]  oseltamivir (TAMIFLU) 75 MG capsule Take 1 capsule (75 mg total) by mouth every 12 (twelve) hours. 01/12/19   Fayrene Helper, PA-C      Allergies    Patient has no known allergies.    Review of Systems   Review of Systems  Cardiovascular:  Positive for chest pain.    Physical Exam Updated Vital Signs BP 112/69 (BP Location: Left Arm)   Pulse 69   Temp 98.3 F (36.8 C) (Oral)   Resp 18   Ht 5\' 8"  (1.727 m)   Wt 95.3 kg   SpO2 96%   BMI 31.93 kg/m  Physical Exam  ED Results / Procedures / Treatments   Labs (all labs ordered are listed, but only abnormal results are displayed) Labs Reviewed  BASIC METABOLIC PANEL - Abnormal; Notable for the following components:       Result Value   Glucose, Bld 147 (*)    Creatinine, Ser 1.31 (*)    All other components within normal limits  CBC  TROPONIN I (HIGH SENSITIVITY)  TROPONIN I (HIGH SENSITIVITY)    EKG None  Radiology DG Chest 2 View Result Date: 12/12/2023 CLINICAL DATA:  Chest pain. EXAM: CHEST - 2 VIEW COMPARISON:  05/04/2022. FINDINGS: Bilateral lung fields are clear. Bilateral costophrenic angles are clear. Normal cardio-mediastinal silhouette. No acute osseous abnormalities. The soft tissues are within normal limits. IMPRESSION: No active cardiopulmonary disease. Electronically Signed   By: Jules Schick M.D.   On: 12/12/2023 15:46    Procedures Procedures  {Document cardiac monitor, telemetry assessment procedure when appropriate:1}  Medications Ordered in ED Medications - No data to display  ED Course/ Medical Decision Making/ A&P   {   Click here for ABCD2, HEART and other calculatorsREFRESH Note before signing :1}                              Medical Decision Making Amount and/or Complexity of Data Reviewed Labs: ordered. Radiology: ordered.  Risk Prescription drug management.    Craig Wheeler is a 37 y.o. male with no significant past medical history who presents with headache, lightheadedness/dizziness, nausea, and some chest  tightness.  According to patient, since last evening he has had headache.  He reports he feels worse than some of the headache she has had in the past but he has had headaches before.  Denies significant photophobia and phonophobia at this time.  Denies neck pain or neck stiffness.  Denies fevers, chills, congestion, cough, constipation, diarrhea, or urinary changes peer denies numbness, tingling, or weakness of extremities.  Reports she had some nausea today but had no vomiting.  Denies any chest or abdominal pain.  Denies any other neurologic complaints to me.  Reports he had some lightheadedness and dizziness with his headache but that has improved  slightly.  He took some Tylenol that seem to help this morning.  Otherwise denies head injury or complaints.  On exam, lungs clear.  Chest nontender.  Abdomen nontender.  Intact sensation strength and pulses in extremities.  Normal finger-nose-finger testing.  Symmetric smile.  Clear speech.  Pupils symmetric and reactive with normal extraocular movements.  Normal neck range of motion.  No evidence of acute trauma to the head.  Patient well-appearing.  Patient had workup in triage including some labs given his reported chest discomfort.  His EKG did not show STEMI and his first troponin is normal, will trend.  We had a shared decision-making conversation with the patient and family both in person and on the phone about head imaging and given his lack of focal neurologic deficits and his history of previous headaches, we agreed to hold on head CT at this time.  We did however agree to do a headache cocktail with fluids, Benadryl, and Compazine and then reassess.  Will get his second troponin.  His other labs showed possible mild dehydration with elevation in creatinine up to 1.3 from baseline seems to be 1.0-1.2.  If patient's headache improves and he is feeling better, dissipate discharge home.  If he develops any new symptoms or other concerning findings, will consider further workup.  Given his lack of neck pain, neck stiffness, fevers, chills, low meningitis so do not feel he needs LP at this time and patient agrees.  Anticipate reassessment to determine disposition.      9:41 PM Patient's troponin reassuring on repeat.  After headache cocktail he reports headache nearly resolved.  He is feeling much much better.  He was sleeping soundly.  Given reassuring exam, labs, and workup, we feel he is safe for discharge home.  Patient and family agree.  Patient will get prescription for Compazine to help with the headache and nausea and will follow-up with primary doctor.  He understood return precautions  and follow instructions and was discharged in good condition with significant treatment and headache, negative dull troponin, and otherwise reassuring workup.      {Document critical care time when appropriate:1} {Document review of labs and clinical decision tools ie heart score, Chads2Vasc2 etc:1}  {Document your independent review of radiology images, and any outside records:1} {Document your discussion with family members, caretakers, and with consultants:1} {Document social determinants of health affecting pt's care:1} {Document your decision making why or why not admission, treatments were needed:1} Final Clinical Impression(s) / ED Diagnoses Final diagnoses:  None    Rx / DC Orders ED Discharge Orders     None

## 2024-01-12 ENCOUNTER — Encounter (HOSPITAL_COMMUNITY): Payer: Self-pay

## 2024-01-12 ENCOUNTER — Ambulatory Visit (HOSPITAL_COMMUNITY)
Admission: EM | Admit: 2024-01-12 | Discharge: 2024-01-12 | Disposition: A | Discharge status: Home / Self Care | Payer: No Typology Code available for payment source | Attending: Family Medicine | Admitting: Family Medicine

## 2024-01-12 DIAGNOSIS — I891 Lymphangitis: Secondary | ICD-10-CM

## 2024-01-12 DIAGNOSIS — L03012 Cellulitis of left finger: Secondary | ICD-10-CM

## 2024-01-12 DIAGNOSIS — L03114 Cellulitis of left upper limb: Secondary | ICD-10-CM | POA: Diagnosis not present

## 2024-01-12 MED ORDER — AMOXICILLIN-POT CLAVULANATE 875-125 MG PO TABS: 1.0000 | ORAL_TABLET | Freq: Two times a day (BID) | ORAL | 0 refills | Status: AC

## 2024-01-12 MED ORDER — MUPIROCIN 2 % EX OINT: 1.0000 | TOPICAL_OINTMENT | Freq: Two times a day (BID) | CUTANEOUS | 0 refills | Status: DC

## 2024-01-12 NOTE — ED Provider Notes (Signed)
[facility]    CSN: [id number] Arrival date & time: 01/12/24  1130      History   Chief Complaint Chief Complaint  Patient presents with   finger issue    HPI Craig Wheeler is a 38 y.o. male.   HPI Here for pain and swelling in his left middle finger and now redness and soreness in his left forearm and hand. About 1 week ago he noted some swelling around the middle fingernail on his left hand.  Now the last 2 days he has noted redness extending in a strip proximally on the dorsum of his left hand and on the dorsum of his left forearm.  The erythematous areas stops at about the elbow.  No fever or chills  NKDA  No chronic conditions like diabetes.  Past Medical History:  Diagnosis Date   Skin abscess     There are no active problems to display for this patient.   History reviewed. No pertinent surgical history.     Home Medications    Prior to Admission medications   Medication Sig Start Date End Date Taking? Authorizing Provider  amoxicillin-clavulanate (AUGMENTIN) 875-125 MG tablet Take 1 tablet by mouth 2 (two) times daily for 7 days. 01/12/24 01/19/24 Yes Zenia Resides, MD  mupirocin ointment (BACTROBAN) 2 % Apply 1 Application topically 2 (two) times daily. To affected area till better 01/12/24  Yes Vaiden Adames, Janace Aris, MD  Homeopathic Products (COLD/FLU MEDICINE PO) Take 2 capsules by mouth every 6 (six) hours as needed (cold/flu symptoms).    [provider]    Family History History reviewed. No pertinent family history.  Social History Social History   Tobacco Use   Smoking status: Former    Current packs/day: 0.50    Types: Cigarettes   Smokeless tobacco: Never  Vaping Use   Vaping status: Former  Substance Use Topics   Alcohol use: Yes    Comment: occ   Drug use: No     Allergies   Patient has no known allergies.   Review of Systems Review of Systems   Physical Exam Triage Vital Signs ED Triage Vitals   Encounter Vitals Group     BP 01/12/24 1243 (!) 153/99     Systolic BP Percentile --      Diastolic BP Percentile --      Pulse Rate 01/12/24 1243 75     Resp 01/12/24 1243 16     Temp 01/12/24 1243 98.4 F (36.9 C)     Temp Source 01/12/24 1243 Oral     SpO2 01/12/24 1243 95 %     Weight --      Height --      Head Circumference --      Peak Flow --      Pain Score 01/12/24 1242 6     Pain Loc --      Pain Education --      Exclude from Growth Chart --    No data found.  Updated Vital Signs BP (!) 153/99 (BP Location: Right Arm)   Pulse 75   Temp 98.4 F (36.9 C) (Oral)   Resp 16   SpO2 95%   Visual Acuity Right Eye Distance:   Left Eye Distance:   Bilateral Distance:    Right Eye Near:   Left Eye Near:    Bilateral Near:     Physical Exam Vitals reviewed.  Constitutional:      General: He is not  in acute distress.    Appearance: He is not ill-appearing, toxic-appearing or diaphoretic.  Cardiovascular:     Rate and Rhythm: Normal rate and regular rhythm.  Pulmonary:     Effort: Pulmonary effort is normal.     Breath sounds: Normal breath sounds.  Skin:    Coloration: Skin is not jaundiced or pale.     Comments: There is swelling and some yellow discoloration of the radial nail fold in the proximal nail fold of the left middle finger.  There is erythema and swelling of the distal left middle finger.  There is fluctuance over the yellow discolored area.  There is an erythematous strip that extends proximally and is about 2 cm wide across the dorsum of his hand onto his proximal left forearm.  The strip of erythema is wider there about 3 cm.  No fluctuance on the hand or forearm.  It is mildly tender over the erythema.  No induration  Neurological:     General: No focal deficit present.     Mental Status: He is alert and oriented to person, place, and time.  Psychiatric:        Behavior: Behavior normal.      UC Treatments / Results  Labs (all labs  ordered are listed, but only abnormal results are displayed) Labs Reviewed - No data to display  EKG   Radiology No results found.  Procedures Procedures (including critical care time)  Medications Ordered in UC Medications - No data to display  Initial Impression / Assessment and Plan / UC Course  I have reviewed the triage vital signs and the nursing notes.  Pertinent labs & imaging results that were available during my care of the patient were reviewed by me and considered in my medical decision making (see chart for details).     Risk and benefits of drainage of the paronychia were discussed and he gave verbal consent.  Under clean conditions and after cleaning the skin with alcohol and number 18-gauge syringe was used to make a small draining hole in the radial nail fold of the left middle finger.  About 1 mL of yellow purulent material was obtained.  Band-Aid was applied.  Augmentin is sent in for cellulitis and lymphangitis.  Warnings given for the ER.  He will go to the emergency room if any of his symptoms are worsening, or if begins to feel physically ill with fever or weakness.  Bactroban is sent in to apply topically to the nail fold.  Final Clinical Impressions(s) / UC Diagnoses   Final diagnoses:  Lymphangitis  Cellulitis of left upper extremity  Paronychia of finger, left     Discharge Instructions      Take amoxicillin-clavulanate 875 mg--1 tab twice daily with food for 7 days  Put mupirocin ointment on the sore area twice daily until improved  Soak the finger in warm water 2 or 3 times a day.  If you worsen in any way--new fever, the redness and tenderness continues to spread, or if you feel very weak or the swelling increases, please go to the emergency room      ED Prescriptions     Medication Sig Dispense Auth. Provider   amoxicillin-clavulanate (AUGMENTIN) 875-125 MG tablet Take 1 tablet by mouth 2 (two) times daily for 7 days. 14 tablet  Tania Perrott, Janace Aris, MD   mupirocin ointment (BACTROBAN) 2 % Apply 1 Application topically 2 (two) times daily. To affected area till better 22 g Zenia Resides,  MD      PDMP not reviewed this encounter.   Zenia Resides, MD 01/12/24 1351

## 2024-01-12 NOTE — ED Triage Notes (Signed)
Patient reports that he has had infection of the left middle finger x 1 week and states he began having redness and swelling in the left hand and up the left forearm since yesterday.

## 2024-01-12 NOTE — Discharge Instructions (Signed)
Take amoxicillin-clavulanate 875 mg--1 tab twice daily with food for 7 days  Put mupirocin ointment on the sore area twice daily until improved  Soak the finger in warm water 2 or 3 times a day.  If you worsen in any way--new fever, the redness and tenderness continues to spread, or if you feel very weak or the swelling increases, please go to the emergency room

## 2024-02-06 ENCOUNTER — Encounter (HOSPITAL_COMMUNITY): Payer: Self-pay

## 2024-02-06 ENCOUNTER — Ambulatory Visit (HOSPITAL_COMMUNITY)
Admission: EM | Admit: 2024-02-06 | Discharge: 2024-02-06 | Disposition: A | Discharge status: Home / Self Care | Payer: Medicaid Other | Attending: Family Medicine | Admitting: Family Medicine

## 2024-02-06 DIAGNOSIS — J069 Acute upper respiratory infection, unspecified: Secondary | ICD-10-CM

## 2024-02-06 DIAGNOSIS — R42 Dizziness and giddiness: Secondary | ICD-10-CM

## 2024-02-06 LAB — POC COVID19/FLU A&B COMBO
Covid Antigen, POC: NEGATIVE
Influenza A Antigen, POC: NEGATIVE
Influenza B Antigen, POC: NEGATIVE

## 2024-02-06 NOTE — ED Triage Notes (Signed)
 Pt c/o of nasal congestion, chest tightness, cough, headaches, fatigue, and positional dizziness.   Home Interventions: Goodie Powder and tylenol   Last Dose: Tylenol (Last night 1230)

## 2024-02-06 NOTE — Discharge Instructions (Signed)
 You were seen today for upper respiratory symptoms and dizziness.  Your flu and covid swab was negative today.  This was likely due to another virus.  I recommend you take over the counter sudafed, zyrtec and flonase for your sinuses, as this could be the cause of your dizziness.  Please return if not improving.

## 2024-02-06 NOTE — ED Provider Notes (Signed)
 [facility]    CSN: [id number] Arrival date & time: 02/06/24  5284      History   Chief Complaint Chief Complaint  Patient presents with   Cough   Dizziness    HPI HARLES EVETTS is a 38 y.o. male.    Cough Associated symptoms: headaches and rhinorrhea   Dizziness Associated symptoms: headaches    Patient is here for URI symptoms.  Started mildly last week, but was worse 2 days ago.  Having a cough, sinus congestion, headache, fatigue, dizziness.  No fevers/chills that he is aware of.  No wheezing or sob.  Yesterday was better than the day before.   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  He does not really feel that way today.  He states he has bad sinuses, but didn't take anything for that this time.        Past Medical History:  Diagnosis Date   Skin abscess     There are no active problems to display for this patient.   History reviewed. No pertinent surgical history.     Home Medications    Prior to Admission medications   Medication Sig Start Date End Date Taking? Authorizing Provider  Homeopathic Products (COLD/FLU MEDICINE PO) Take 2 capsules by mouth every 6 (six) hours as needed (cold/flu symptoms).    [provider]  mupirocin ointment (BACTROBAN) 2 % Apply 1 Application topically 2 (two) times daily. To affected area till better 01/12/24   Zenia Resides, MD    Family History History reviewed. No pertinent family history.  Social History Social History   Tobacco Use   Smoking status: Former    Current packs/day: 0.50    Types: Cigarettes   Smokeless tobacco: Never  Vaping Use   Vaping status: Former  Substance Use Topics   Alcohol use: Yes    Comment: occ   Drug use: No     Allergies   Patient has no known allergies.   Review of Systems Review of Systems  Constitutional: Negative.   HENT:  Positive for congestion and rhinorrhea.   Respiratory:  Positive for cough.    Cardiovascular: Negative.   Gastrointestinal: Negative.   Musculoskeletal: Negative.   Neurological:  Positive for dizziness and headaches.  Psychiatric/Behavioral: Negative.       Physical Exam Triage Vital Signs ED Triage Vitals  Encounter Vitals Group     BP 02/06/24 0934 134/87     Systolic BP Percentile --      Diastolic BP Percentile --      Pulse Rate 02/06/24 0934 78     Resp 02/06/24 0934 16     Temp 02/06/24 0934 98.2 F (36.8 C)     Temp Source 02/06/24 0934 Oral     SpO2 02/06/24 0934 97 %     Weight --      Height --      Head Circumference --      Peak Flow --      Pain Score 02/06/24 0932 0     Pain Loc --      Pain Education --      Exclude from Growth Chart --    No data found.  Updated Vital Signs BP 134/87 (BP Location: Left Arm)   Pulse 78   Temp 98.2 F (36.8 C) (Oral)   Resp 16   SpO2 97%   Visual Acuity Right Eye Distance:   Left Eye Distance:  Bilateral Distance:    Right Eye Near:   Left Eye Near:    Bilateral Near:     Physical Exam Constitutional:      General: He is not in acute distress.    Appearance: Normal appearance. He is normal weight. He is not ill-appearing or toxic-appearing.  HENT:     Right Ear: Tympanic membrane normal.     Nose: Congestion present.     Mouth/Throat:     Mouth: Mucous membranes are moist.     Pharynx: Posterior oropharyngeal erythema present.  Cardiovascular:     Rate and Rhythm: Normal rate and regular rhythm.  Pulmonary:     Effort: Pulmonary effort is normal.     Breath sounds: Normal breath sounds.  Musculoskeletal:     Cervical back: Normal range of motion and neck supple. No tenderness.  Skin:    General: Skin is warm.  Neurological:     General: No focal deficit present.     Mental Status: He is alert.  Psychiatric:        Mood and Affect: Mood normal.      UC Treatments / Results  Labs (all labs ordered are listed, but only abnormal results are displayed) Labs Reviewed   POC COVID19/FLU A&B COMBO    EKG   Radiology No results found.  Procedures Procedures (including critical care time)  Medications Ordered in UC Medications - No data to display  Initial Impression / Assessment and Plan / UC Course  I have reviewed the triage vital signs and the nursing notes.  Pertinent labs & imaging results that were available during my care of the patient were reviewed by me and considered in my medical decision making (see chart for details).  Final Clinical Impressions(s) / UC Diagnoses   Final diagnoses:  Upper respiratory tract infection, unspecified type  Dizziness     Discharge Instructions      You were seen today for upper respiratory symptoms and dizziness.  Your flu and covid swab was negative today.  This was likely due to another virus.  I recommend you take over the counter sudafed, zyrtec and flonase for your sinuses, as this could be the cause of your dizziness.  Please return if not improving.     ED Prescriptions   None    PDMP not reviewed this encounter.   Jannifer Franklin, MD 02/06/24 1003

## 2024-03-27 IMAGING — CR DG CHEST 2V
2 series · 2 of 2 positions shown · non-contrast
Comparison: Chest x-ray October 26, 2018.

CLINICAL DATA: ShOB

EXAM:
CHEST - 2 VIEW

[w chest pa]
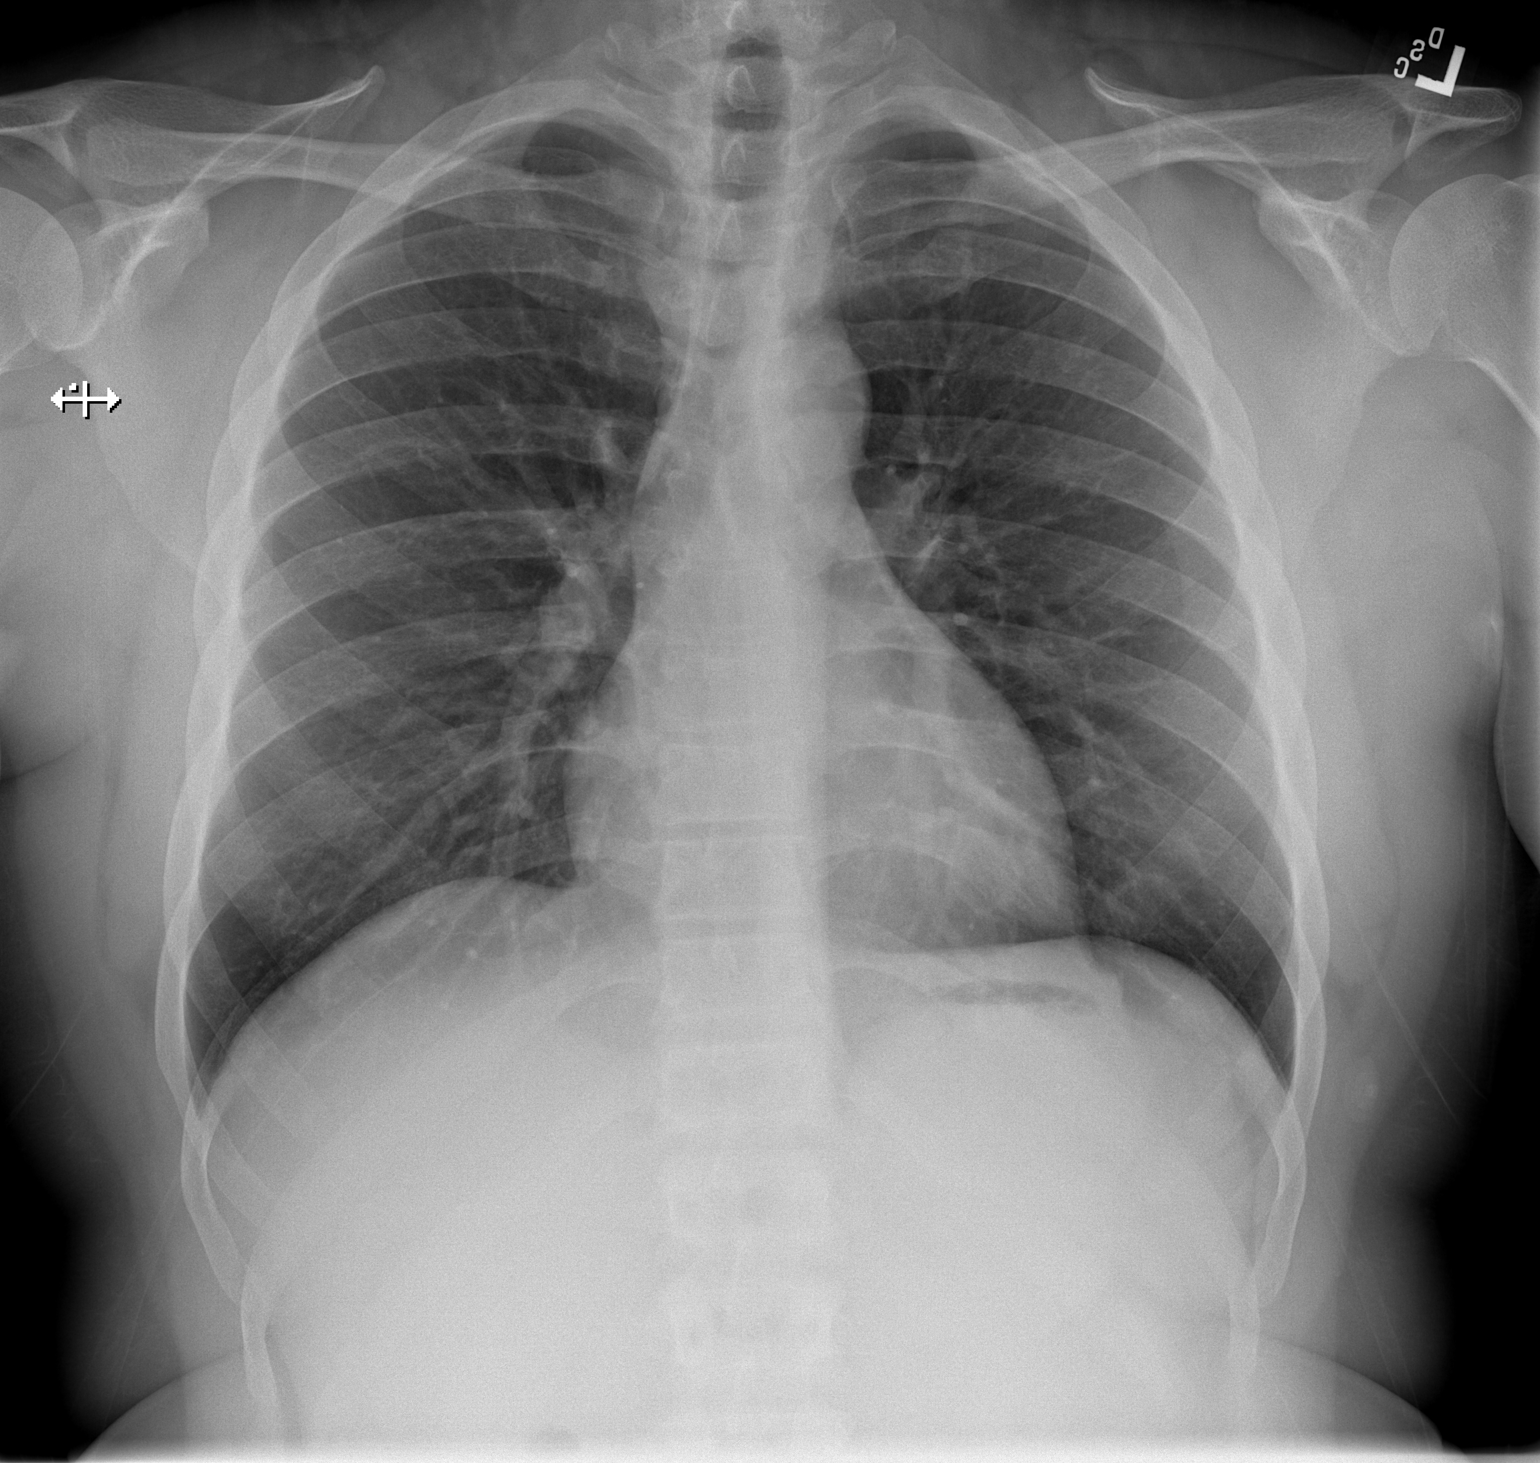

[w chest lat]
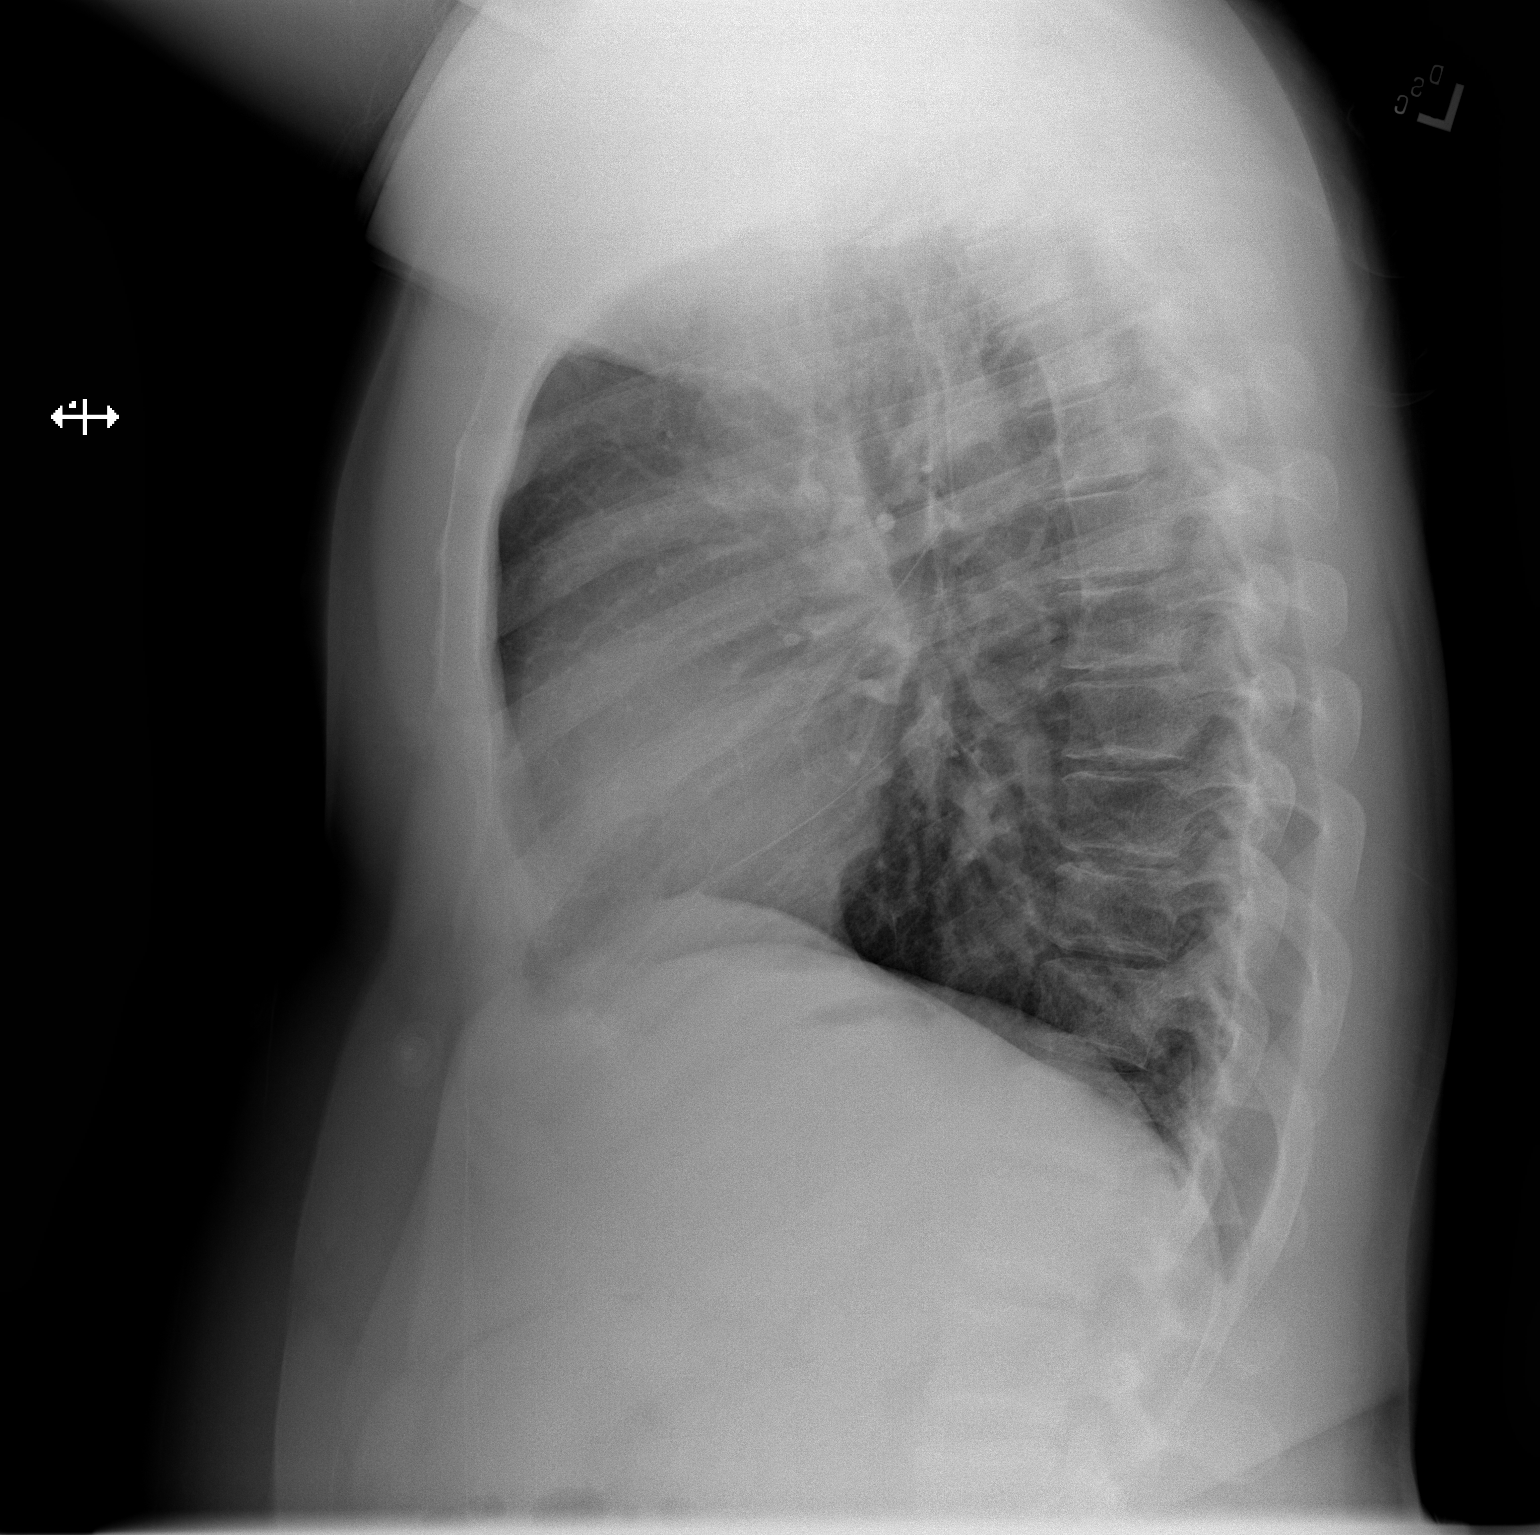

[2 of 2 positions shown; findings below may reference images not displayed]

FINDINGS: No consolidation. No visible pleural effusions or pneumothorax.
Cardiomediastinal silhouette is within normal limits. No significant
change from the prior.
IMPRESSION: No evidence acute cardiopulmonary disease.

## 2024-07-02 ENCOUNTER — Encounter: Admitting: Physician Assistant

## 2024-07-02 NOTE — Progress Notes (Signed)
 Patient's wife attempted to complete video visit under patient's chart as she was unable to sign into her account. Advised we are unable to complete visit under her husbands chart.

## 2024-09-27 ENCOUNTER — Emergency Department (HOSPITAL_COMMUNITY)
Admission: EM | Admit: 2024-09-27 | Discharge: 2024-09-28 | Disposition: A | Discharge status: Home / Self Care | Payer: Worker's Compensation | Attending: Emergency Medicine | Admitting: Emergency Medicine

## 2024-09-27 ENCOUNTER — Emergency Department (HOSPITAL_COMMUNITY): Payer: Worker's Compensation

## 2024-09-27 ENCOUNTER — Ambulatory Visit (HOSPITAL_COMMUNITY)
Admission: EM | Admit: 2024-09-27 | Discharge: 2024-09-27 | Disposition: A | Discharge status: Home / Self Care | Payer: Self-pay | Attending: Family Medicine | Admitting: Family Medicine

## 2024-09-27 ENCOUNTER — Encounter (HOSPITAL_COMMUNITY): Payer: Self-pay | Admitting: *Deleted

## 2024-09-27 ENCOUNTER — Encounter (HOSPITAL_COMMUNITY): Payer: Self-pay

## 2024-09-27 ENCOUNTER — Other Ambulatory Visit: Payer: Self-pay

## 2024-09-27 DIAGNOSIS — Y990 Civilian activity done for income or pay: Secondary | ICD-10-CM | POA: Diagnosis not present

## 2024-09-27 DIAGNOSIS — S0591XA Unspecified injury of right eye and orbit, initial encounter: Secondary | ICD-10-CM

## 2024-09-27 DIAGNOSIS — S0501XA Injury of conjunctiva and corneal abrasion without foreign body, right eye, initial encounter: Secondary | ICD-10-CM | POA: Insufficient documentation

## 2024-09-27 DIAGNOSIS — H538 Other visual disturbances: Secondary | ICD-10-CM

## 2024-09-27 DIAGNOSIS — W458XXA Other foreign body or object entering through skin, initial encounter: Secondary | ICD-10-CM | POA: Insufficient documentation

## 2024-09-27 DIAGNOSIS — H5711 Ocular pain, right eye: Secondary | ICD-10-CM

## 2024-09-27 DIAGNOSIS — H1131 Conjunctival hemorrhage, right eye: Secondary | ICD-10-CM | POA: Diagnosis not present

## 2024-09-27 MED ORDER — TETRACAINE HCL 0.5 % OP SOLN
2.0000 [drp] | Freq: Once | OPHTHALMIC | Status: AC
  Administered 2024-09-27: 2 [drp] via OPHTHALMIC
  Filled 2024-09-27: qty 4

## 2024-09-27 MED ORDER — FLUORESCEIN SODIUM 1 MG OP STRP
1.0000 | ORAL_STRIP | Freq: Once | OPHTHALMIC | Status: AC
  Administered 2024-09-27: 1 via OPHTHALMIC
  Filled 2024-09-27: qty 1

## 2024-09-27 MED ORDER — TETRACAINE HCL 0.5 % OP SOLN
1.0000 [drp] | Freq: Once | OPHTHALMIC | Status: AC
  Administered 2024-09-27: 1 [drp] via OPHTHALMIC
  Filled 2024-09-27: qty 4

## 2024-09-27 NOTE — ED Notes (Signed)
 The pt also want his rt shoulder looked at its been hurting for 2 weeks.  He thought that since he was here he would get it checked also

## 2024-09-27 NOTE — ED Triage Notes (Signed)
 Pt states that he has some redness, drainage, and pain of his right eye. X1 day Pt states that he poked himself in the eye while at work.  Pt states that he has some right shoulder discomfort. Pt denies any known injury. X1 month  Pt states that he has so me left ringer pain.  X3 months Pt states that  he injured it while working.

## 2024-09-27 NOTE — ED Provider Notes (Signed)
 [facility]    CSN: [id number] Arrival date & time: 09/27/24  1605      History   Chief Complaint Chief Complaint  Patient presents with   Eye Pain   shoulder discomfort   Finger Injury    HPI Craig Wheeler is a 38 y.o. male.    Eye Pain  Here for right eye pain  This afternoon at work he accidentally poked himself in the eye with something.  Since then he has decreased vision in that right eye with tearing and pain.  He also mentions right shoulder pain for about a month No injury on his shoulder  He also has some left ring finger pain for about 3 months that might have been injured at work  NKDA  He does not wear glasses and usually he can see well out of both eyes.  Past Medical History:  Diagnosis Date   Skin abscess     There are no active problems to display for this patient.   History reviewed. No pertinent surgical history.     Home Medications    Prior to Admission medications   Medication Sig Start Date End Date Taking? Authorizing Provider  IBUPROFEN  PO Take by mouth.   Yes [provider]  Homeopathic Products (COLD/FLU MEDICINE PO) Take 2 capsules by mouth every 6 (six) hours as needed (cold/flu symptoms).    [provider]    Family History History reviewed. No pertinent family history.  Social History Social History   Tobacco Use   Smoking status: Former    Current packs/day: 0.50    Types: Cigarettes   Smokeless tobacco: Never  Vaping Use   Vaping status: Every Day  Substance Use Topics   Alcohol use: Yes    Comment: occ   Drug use: No     Allergies   Patient has no known allergies.   Review of Systems Review of Systems  Eyes:  Positive for pain.     Physical Exam Triage Vital Signs ED Triage Vitals  Encounter Vitals Group     BP 09/27/24 1649 (!) 146/91     Girls Systolic BP Percentile --      Girls Diastolic BP Percentile --      Boys Systolic BP Percentile --      Boys  Diastolic BP Percentile --      Pulse Rate 09/27/24 1649 73     Resp 09/27/24 1649 18     Temp 09/27/24 1649 98.9 F (37.2 C)     Temp Source 09/27/24 1649 Oral     SpO2 09/27/24 1649 96 %     Weight 09/27/24 1648 205 lb (93 kg)     Height 09/27/24 1648 5' 8 (1.727 m)     Head Circumference --      Peak Flow --      Pain Score 09/27/24 1647 10     Pain Loc --      Pain Education --      Exclude from Growth Chart --    No data found.  Updated Vital Signs BP (!) 146/91 (BP Location: Right Arm)   Pulse 73   Temp 98.9 F (37.2 C) (Oral)   Resp 18   Ht 5' 8 (1.727 m)   Wt 93 kg   SpO2 96%   BMI 31.17 kg/m   Visual Acuity Right Eye Distance: 20/70 Left Eye Distance: 20/25 Bilateral Distance:    Right Eye Near:   Left Eye  Near:    Bilateral Near:     Physical Exam Vitals reviewed.  Constitutional:      General: He is not in acute distress.    Appearance: He is not ill-appearing, toxic-appearing or diaphoretic.  HENT:     Mouth/Throat:     Mouth: Mucous membranes are moist.  Eyes:     Extraocular Movements: Extraocular movements intact.     Comments: There is a red mark about 1 cm long and about 2 mm wide in a horizontal orientation on the lateral part of his right conjunctiva.  I do not see any definite swelling.  Visual acuity is 20/70 in the right eye.  There is some tearing while I am examining him  Skin:    Coloration: Skin is not pale.  Neurological:     Mental Status: He is alert and oriented to person, place, and time.  Psychiatric:        Behavior: Behavior normal.      UC Treatments / Results  Labs (all labs ordered are listed, but only abnormal results are displayed) Labs Reviewed - No data to display  EKG   Radiology No results found.  Procedures Procedures (including critical care time)  Medications Ordered in UC Medications - No data to display  Initial Impression / Assessment and Plan / UC Course  I have reviewed the triage  vital signs and the nursing notes.  Pertinent labs & imaging results that were available during my care of the patient were reviewed by me and considered in my medical decision making (see chart for details).     With his blurry vision and decreased visual acuity in the right eye that was injured, I think he needs urgent evaluation in the emergency room.  We do not have a slit-lamp in our urgent care facility to further evaluate him.  He is agreeable and will go by private vehicle. Final Clinical Impressions(s) / UC Diagnoses   Final diagnoses:  None   Discharge Instructions   None    ED Prescriptions   None    PDMP not reviewed this encounter.   Vonna Sharlet POUR, MD 09/27/24 [phone]

## 2024-09-27 NOTE — Discharge Instructions (Signed)
 He will go to the emergency room for further evaluation

## 2024-09-27 NOTE — ED Provider Triage Note (Signed)
 Emergency Medicine Provider Triage Evaluation Note  Craig Wheeler , Wheeler 38 y.o. male  was evaluated in triage.  Pt complains of right eye pain. Reports Wheeler small piece of wood struck him in the eye earlier today. Went to [facility] sent him here for an eye exam.  Reports his vision is blurry in this eye.  Denies any painful eye movements.  Also notes that he has been having Wheeler month of shoulder pain that has been unchanged for the last month.  Denies any trauma.  Did feel Wheeler pop about Wheeler month ago and would like this to be evaluated as well.  Review of Systems  Positive:  Negative:   Physical Exam  BP (!) 151/94 (BP Location: Right Arm)   Pulse 71   Temp 98.5 F (36.9 C)   Resp 18   Ht 5' 8 (1.727 m)   Wt 93 kg   SpO2 100%   BMI 31.17 kg/m  Gen:   Awake, no distress   Resp:  Normal effort  MSK:   Moves extremities without difficulty  Other:  Right eye with conjunctival erythema, PERRLA and EOMs intact.   Medical Decision Making  Medically screening exam initiated at 5:56 PM.  Appropriate orders placed.  Craig Wheeler was informed that the remainder of the evaluation will be completed by another provider, this initial triage assessment does not replace that evaluation, and the importance of remaining in the ED until their evaluation is complete.     Craig Baby A, PA-C 09/27/24 1758

## 2024-09-27 NOTE — ED Triage Notes (Signed)
 Pt reports R eye pain, redness x1 day. Pt states that something scratched his eye while he was at work. Unsure about workmans comp.

## 2024-09-27 NOTE — ED Notes (Signed)
 Rt eye injury at work 1300 today  he reports pain and some visual disturbances since the injury.  He wants to be referred to an eye doctor

## 2024-09-28 MED ORDER — CIPROFLOXACIN HCL 0.3 % OP SOLN: 1.0000 [drp] | OPHTHALMIC | 0 refills | Status: DC

## 2024-09-28 MED ORDER — CIPROFLOXACIN HCL 0.3 % OP SOLN: 1.0000 [drp] | OPHTHALMIC | 0 refills | Status: AC

## 2024-09-28 NOTE — ED Provider Notes (Signed)
 [facility] Provider Note   CSN: [id number] Arrival date & time: 09/27/24  1710     Patient presents with: Eye Injury   Craig Wheeler is a 38 y.o. male.   The history is provided by the patient.  Eye Injury This is a new problem. The current episode started 12 to 24 hours ago. The problem occurs constantly. The problem has not changed since onset.Pertinent negatives include no chest pain, no abdominal pain, no headaches and no shortness of breath. Nothing aggravates the symptoms. Nothing relieves the symptoms. He has tried nothing for the symptoms. The treatment provided no relief.       Prior to Admission medications   Medication Sig Start Date End Date Taking? Authorizing Provider  Homeopathic Products (COLD/FLU MEDICINE PO) Take 2 capsules by mouth every 6 (six) hours as needed (cold/flu symptoms).    [provider]  IBUPROFEN  PO Take by mouth.    [provider]    Allergies: Patient has no known allergies.    Review of Systems  Eyes:  Positive for pain and redness. Negative for discharge and itching.  Respiratory:  Negative for shortness of breath.   Cardiovascular:  Negative for chest pain.  Gastrointestinal:  Negative for abdominal pain.  Neurological:  Negative for headaches.    Updated Vital Signs BP 125/81 (BP Location: Right Arm)   Pulse 62   Temp 97.9 F (36.6 C) (Oral)   Resp 17   Ht 5' 8 (1.727 m)   Wt 93 kg   SpO2 100%   BMI 31.17 kg/m   Physical Exam Vitals and nursing note reviewed.  Constitutional:      General: He is not in acute distress.    Appearance: Normal appearance. He is well-developed. He is not diaphoretic.  HENT:     Head: Normocephalic and atraumatic.     Nose: Nose normal.  Eyes:     General: Lids are normal. Lids are everted, no foreign bodies appreciated.     Intraocular pressure: Right eye pressure is 18 mmHg.     Extraocular Movements: Extraocular  movements intact.     Conjunctiva/sclera: Conjunctivae normal.     Pupils: Pupils are equal, round, and reactive to light.   Cardiovascular:     Rate and Rhythm: Normal rate and regular rhythm.     Pulses: Normal pulses.     Heart sounds: Normal heart sounds.  Pulmonary:     Effort: Pulmonary effort is normal.     Breath sounds: Normal breath sounds. No wheezing or rales.  Abdominal:     General: Bowel sounds are normal.     Palpations: Abdomen is soft.     Tenderness: There is no abdominal tenderness. There is no guarding or rebound.  Musculoskeletal:        General: Normal range of motion.     Cervical back: Normal range of motion and neck supple.  Skin:    General: Skin is warm and dry.     Capillary Refill: Capillary refill takes less than 2 seconds.  Neurological:     General: No focal deficit present.     Mental Status: He is alert and oriented to person, place, and time.     Deep Tendon Reflexes: Reflexes normal.     (all labs ordered are listed, but only abnormal results are displayed) Labs Reviewed - No data to display  EKG: None  Radiology: DG Shoulder Right Result Date: 09/27/2024 CLINICAL DATA:  Shoulder pain EXAM: RIGHT SHOULDER - 2+ VIEW COMPARISON:  None Available. FINDINGS: There is no evidence of fracture or dislocation. There is no evidence of arthropathy or other focal bone abnormality. Soft tissues are unremarkable. IMPRESSION: Negative. Electronically Signed   By: Franky Crease M.D.   On: 09/27/2024 18:24     Procedures   Medications Ordered in the ED  fluorescein ophthalmic strip 1 strip (1 strip Right Eye Given 09/27/24 2322)  tetracaine (PONTOCAINE) 0.5 % ophthalmic solution 1 drop (1 drop Right Eye Given 09/27/24 2322)  fluorescein ophthalmic strip 1 strip (1 strip Both Eyes Given 09/27/24 2322)  tetracaine (PONTOCAINE) 0.5 % ophthalmic solution 2 drop (2 drops Both Eyes Given 09/27/24 2323)                                    Medical  Decision Making Piece of wood to the eye and ongoing shoulder pain   Amount and/or Complexity of Data Reviewed External Data Reviewed: notes.    Details: Previous notes reviewed  Radiology: ordered and independent interpretation performed.    Details: No fracture   Risk Prescription drug management. Risk Details: Patient with corneal abrasion and small subconjunctival hemorrhage.  Will treat with antibiotic drops and close follow up with the ophthalmologist.  Follow up with PMD for ongoing shoulder pain.  Stable for discharge with close follow up.      Final diagnoses:  None  No signs of systemic illness or infection. The patient is nontoxic-appearing on exam and vital signs are within normal limits.  I have reviewed the triage vital signs and the nursing notes. Pertinent labs & imaging results that were available during my care of the patient were reviewed by me and considered in my medical decision making (see chart for details). After history, exam, and medical workup I feel the patient has been appropriately medically screened and is safe for discharge home. Pertinent diagnoses were discussed with the patient. Patient was given return precautions.     ED Discharge Orders     None          Khamiyah Grefe, MD 09/28/24 0045
